# Patient Record
Sex: Female | Born: 1975 | Race: Black or African American | Hispanic: No | Marital: Married | Smoking: Never smoker
Health system: Southern US, Community
[De-identification: ages and names within clinical notes are randomized; demographics above are authoritative.]

## PROBLEM LIST (undated history)

## (undated) DIAGNOSIS — F329 Major depressive disorder, single episode, unspecified: Secondary | ICD-10-CM

## (undated) DIAGNOSIS — G43909 Migraine, unspecified, not intractable, without status migrainosus: Secondary | ICD-10-CM

## (undated) DIAGNOSIS — F32A Depression, unspecified: Secondary | ICD-10-CM

## (undated) DIAGNOSIS — I10 Essential (primary) hypertension: Secondary | ICD-10-CM

## (undated) DIAGNOSIS — F419 Anxiety disorder, unspecified: Secondary | ICD-10-CM

## (undated) DIAGNOSIS — D329 Benign neoplasm of meninges, unspecified: Secondary | ICD-10-CM

## (undated) HISTORY — DX: Depression, unspecified: F32.A

## (undated) HISTORY — DX: Essential (primary) hypertension: I10

## (undated) HISTORY — DX: Anxiety disorder, unspecified: F41.9

---
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
## 2000-02-10 ENCOUNTER — Other Ambulatory Visit: Admission: RE | Admit: 2000-02-10 | Discharge: 2000-02-10 | Payer: Self-pay | Admitting: Obstetrics & Gynecology

## 2000-06-22 ENCOUNTER — Inpatient Hospital Stay (HOSPITAL_COMMUNITY): Admission: AD | Admit: 2000-06-22 | Discharge: 2000-06-26 | Payer: Self-pay | Admitting: Obstetrics & Gynecology

## 2000-07-17 ENCOUNTER — Ambulatory Visit (HOSPITAL_COMMUNITY): Admission: RE | Admit: 2000-07-17 | Discharge: 2000-07-17 | Payer: Self-pay | Admitting: Obstetrics & Gynecology

## 2000-07-17 ENCOUNTER — Encounter: Payer: Self-pay | Admitting: Obstetrics & Gynecology

## 2000-07-23 ENCOUNTER — Encounter (INDEPENDENT_AMBULATORY_CARE_PROVIDER_SITE_OTHER): Payer: Self-pay | Admitting: Specialist

## 2000-07-23 ENCOUNTER — Inpatient Hospital Stay (HOSPITAL_COMMUNITY): Admission: AD | Admit: 2000-07-23 | Discharge: 2000-07-27 | Payer: Self-pay | Admitting: Obstetrics and Gynecology

## 2000-07-25 ENCOUNTER — Encounter: Payer: Self-pay | Admitting: *Deleted

## 2000-09-04 ENCOUNTER — Other Ambulatory Visit: Admission: RE | Admit: 2000-09-04 | Discharge: 2000-09-04 | Payer: Self-pay | Admitting: Obstetrics & Gynecology

## 2001-11-22 ENCOUNTER — Other Ambulatory Visit: Admission: RE | Admit: 2001-11-22 | Discharge: 2001-11-22 | Payer: Self-pay | Admitting: Obstetrics & Gynecology

## 2002-02-07 ENCOUNTER — Other Ambulatory Visit: Admission: RE | Admit: 2002-02-07 | Discharge: 2002-02-07 | Payer: Self-pay | Admitting: Obstetrics and Gynecology

## 2002-08-08 ENCOUNTER — Other Ambulatory Visit: Admission: RE | Admit: 2002-08-08 | Discharge: 2002-08-08 | Payer: Self-pay | Admitting: Obstetrics and Gynecology

## 2003-01-23 ENCOUNTER — Other Ambulatory Visit: Admission: RE | Admit: 2003-01-23 | Discharge: 2003-01-23 | Payer: Self-pay | Admitting: Obstetrics and Gynecology

## 2009-02-01 ENCOUNTER — Encounter: Admission: RE | Admit: 2009-02-01 | Discharge: 2009-02-21 | Payer: Self-pay | Admitting: Specialist

## 2009-10-15 ENCOUNTER — Ambulatory Visit (HOSPITAL_COMMUNITY): Admission: RE | Admit: 2009-10-15 | Discharge: 2009-10-15 | Payer: Self-pay | Admitting: Obstetrics & Gynecology

## 2010-01-16 ENCOUNTER — Encounter
Admission: RE | Admit: 2010-01-16 | Discharge: 2010-01-16 | Payer: Self-pay | Source: Home / Self Care | Attending: Obstetrics & Gynecology | Admitting: Obstetrics & Gynecology

## 2010-03-14 ENCOUNTER — Inpatient Hospital Stay (HOSPITAL_COMMUNITY)
Admission: AD | Admit: 2010-03-14 | Discharge: 2010-03-17 | Payer: Self-pay | Source: Home / Self Care | Attending: Obstetrics & Gynecology | Admitting: Obstetrics & Gynecology

## 2010-03-18 LAB — CBC
HCT: 33.7 % — ABNORMAL LOW (ref 36.0–46.0)
HCT: 28.9 % — ABNORMAL LOW (ref 36.0–46.0)
Hemoglobin: 10.8 g/dL — ABNORMAL LOW (ref 12.0–15.0)
Hemoglobin: 9.3 g/dL — ABNORMAL LOW (ref 12.0–15.0)
MCH: 24.4 pg — ABNORMAL LOW (ref 26.0–34.0)
MCH: 24.6 pg — ABNORMAL LOW (ref 26.0–34.0)
MCHC: 32 g/dL (ref 30.0–36.0)
MCHC: 32.2 g/dL (ref 30.0–36.0)
MCV: 76.2 fL — ABNORMAL LOW (ref 78.0–100.0)
MCV: 76.5 fL — ABNORMAL LOW (ref 78.0–100.0)
Platelets: 227 10*3/uL (ref 150–400)
Platelets: 220 10*3/uL (ref 150–400)
RBC: 4.42 MIL/uL (ref 3.87–5.11)
RBC: 3.78 MIL/uL — ABNORMAL LOW (ref 3.87–5.11)
RDW: 13.2 % (ref 11.5–15.5)
RDW: 13.1 % (ref 11.5–15.5)
WBC: 9.6 10*3/uL (ref 4.0–10.5)
WBC: 15.1 10*3/uL — ABNORMAL HIGH (ref 4.0–10.5)

## 2010-03-18 LAB — GLUCOSE, CAPILLARY: Glucose-Capillary: 88 mg/dL (ref 70–99)

## 2010-03-18 LAB — RPR: RPR Ser Ql: NONREACTIVE

## 2010-03-20 ENCOUNTER — Ambulatory Visit
Admission: RE | Admit: 2010-03-20 | Discharge: 2010-03-20 | Payer: Self-pay | Source: Home / Self Care | Admitting: Obstetrics & Gynecology

## 2010-03-22 ENCOUNTER — Ambulatory Visit
Admission: RE | Admit: 2010-03-22 | Discharge: 2010-03-22 | Payer: Self-pay | Source: Home / Self Care | Admitting: Obstetrics & Gynecology

## 2010-03-26 ENCOUNTER — Ambulatory Visit
Admission: RE | Admit: 2010-03-26 | Discharge: 2010-03-26 | Payer: Self-pay | Source: Home / Self Care | Attending: Obstetrics & Gynecology | Admitting: Obstetrics & Gynecology

## 2010-03-28 ENCOUNTER — Encounter (HOSPITAL_COMMUNITY)
Admission: RE | Admit: 2010-03-28 | Discharge: 2010-03-28 | Disposition: A | Discharge status: Home / Self Care | Payer: Self-pay | Source: Ambulatory Visit | Attending: Obstetrics & Gynecology | Admitting: Obstetrics & Gynecology

## 2010-03-28 ENCOUNTER — Encounter (HOSPITAL_COMMUNITY)
Admission: RE | Admit: 2010-03-28 | Discharge: 2010-03-28 | Disposition: A | Discharge status: Home / Self Care | Payer: Self-pay | Source: Ambulatory Visit

## 2010-03-28 DIAGNOSIS — O923 Agalactia: Secondary | ICD-10-CM | POA: Insufficient documentation

## 2010-04-01 ENCOUNTER — Encounter (HOSPITAL_COMMUNITY): Payer: Managed Care, Other (non HMO)

## 2010-04-08 ENCOUNTER — Ambulatory Visit (HOSPITAL_COMMUNITY)
Admission: RE | Admit: 2010-04-08 | Discharge: 2010-04-08 | Disposition: A | Discharge status: Home / Self Care | Payer: Self-pay | Source: Ambulatory Visit | Attending: Obstetrics & Gynecology | Admitting: Obstetrics & Gynecology

## 2010-04-08 DIAGNOSIS — O923 Agalactia: Secondary | ICD-10-CM | POA: Insufficient documentation

## 2010-04-28 ENCOUNTER — Encounter (HOSPITAL_COMMUNITY)
Admission: RE | Admit: 2010-04-28 | Discharge: 2010-04-28 | Disposition: A | Discharge status: Home / Self Care | Payer: Self-pay | Source: Ambulatory Visit | Attending: Obstetrics & Gynecology | Admitting: Obstetrics & Gynecology

## 2010-04-28 DIAGNOSIS — O923 Agalactia: Secondary | ICD-10-CM | POA: Insufficient documentation

## 2010-04-29 ENCOUNTER — Encounter (HOSPITAL_COMMUNITY)
Admission: RE | Admit: 2010-04-29 | Discharge: 2010-04-29 | Disposition: A | Discharge status: Home / Self Care | Payer: Self-pay | Source: Ambulatory Visit | Attending: Obstetrics & Gynecology | Admitting: Obstetrics & Gynecology

## 2010-06-29 ENCOUNTER — Encounter (HOSPITAL_COMMUNITY)
Admission: RE | Admit: 2010-06-29 | Discharge: 2010-06-29 | Disposition: A | Discharge status: Home / Self Care | Payer: Self-pay | Source: Ambulatory Visit | Attending: Obstetrics & Gynecology | Admitting: Obstetrics & Gynecology

## 2010-06-29 DIAGNOSIS — O923 Agalactia: Secondary | ICD-10-CM | POA: Insufficient documentation

## 2010-07-12 NOTE — H&P (Signed)
[facility]  Patient:    Teresa Liu, Teresa Liu                    MRN: [id number] Adm. Date:  [id number] Attending:  Maxie Better                         History and Physical  REASON FOR ADMISSION:         Intrauterine pregnancy at 33 weeks and 5 days. Spontaneous rupture of membranes.  HISTORY OF PRESENT ILLNESS:   This is a 35 year old single black female, gravida 1, para 0, abortus 0 with a due date of August 08, 2000 being admitted at 33 weeks and 5 days after experiencing spontaneous rupture of membranes on Jul 23, 2000 at 10:40 p.m. with abundant clear fluid.  She comes in denying any contractions or any pain, reporting good fetal activity and denying any bleeding.  Her group  B Strep was done in the office on Jul 23, 2000 and is still pending.  PRENATAL COURSE:              Blood type B positive.  Sickle cell trait negative. Thalassemia negative.  RPR nonreactive.  Rubella immune.  HBSAG negative.  HIV nonreactive.  Gonorrhea negative.  Chlamydia negative.  A 16-week AFP was within normal limits.  A 20-week ultrasound revealed anterior placenta.  Amniotic fluid within normal limits .  Cervical length at 3.4 cm. Anatomy survey was overall normal except for a cord insertion mass suggestive of omphalocele.  Due to that finding, the patient was sent to [facility] for a level 2 ultrasound and genetic consult.  Their ultrasound revealed size equal dates.  A small omphalocele defect was found and mild bilateral pyelectasis was also found.  Amniocentesis was performed without complication with a normal female karyotype.  A 24-week ultrasound resolved pyelectasis, average for gestational age at 52rd percentile, amniotic fluid index normal, omphalocele not growing, unchanged from previous exam.  A 28-week glucose tolerance test was within normal limits.  A 29-week ultrasound revealed polyhydramnios with an amniotic fluid index at 24.9, or 99th  percentile. 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  The baby was average for gestational age, 58th percentile.  At that visit, the patient was found to have preterm cervical change with a vaginal exam of 1+ cm, 40% effaced, vertex -2 to -1.  She was sent to maternity admission to receive betamethasone and was admitted with contractions, which were managed with Procardia.  At 30 weeks, ultrasound revealed an unchanged omphalocele, average for gestational age, persistent polyhydramnios.  At 32 weeks, unchanged omphalocele, unchanged polyhydramnios, biophysical profile 8 on 8, normal Dopplers.  On May 24, at 32 weeks and 5 days, an ultrasound done at [facility] revealed small omphalocele.  Containing liver was noted as well as a mild enlargement of the right lateral cerebral ventricle measuring 11 mm.  Biophysical profile was 8 on 8.  The cervix was shortened with a 2 cm end length.  Ultrasound on May 30 (yesterday) revealed an unchanged omphalocele, average for gestational age with a weight evaluated at 5 lb 3 oz, in the 51st percentile.  Amniotic fluid index was now normal at 15.5, 62nd percentile.  The ventricles were remeasured and were within normal limits.  ALLERGIES:                    No known drug allergies.  PAST MEDICAL HISTORY:         History of migraines.  FAMILY HISTORY:               Father with chronic hypertension.  SOCIAL HISTORY:               She is single.  Non smoker.  [employer] customer representative.  PHYSICAL EXAMINATION:  VITAL SIGNS:                  Normal with blood pressure 110/63, temperature 97.2, pulse 77.  HEENT:                        Negative.  LUNGS:                        Clear.  HEART:                        Normal.  ABDOMEN:                      Gravid, nontender.  Size equal to date.  VAGINAL:                      (Performed by admitting nurse)  The cervix was 2-3 cm, 50% effaced, vertex -2 with  abundant clear fluid, positive Nitrazine, positive fern.  Fetal heart rate baseline 130-145.  Good variability with accelerations.  Occasional mild variable deceleration.  EXTREMITIES:                  Negative.  ASSESSMENT:                   Intrauterine pregnancy at 33 weeks and 5 days with premature rupture of membranes, group B Strep pending, small omphalocele.  PLAN:                         The patient is admitted with continuous monitoring, expectant management, pending group B Strep.  Will start antibiotics in active labor or with signs of chorioamnionitis.  The pediatric surgeon will be informed of the patients presence in the hospital and soon to be delivery.  Management discussed with Dr. Seymour Bars, who agrees. DD:  07/24/00 TD:  07/24/00 Job: [id number]

## 2010-07-12 NOTE — Discharge Summary (Signed)
[facility]  Patient:    Teresa Liu, Teresa Liu                    MRN: [id number] Adm. Date:  [id number] Disc. Date: [id number] Attending:  Silverio Lay A                           Discharge Summary  ADMISSION DIAGNOSES: 1. A 29-week intrauterine pregnancy. 2. Preterm labor.  DISCHARGE DIAGNOSES: 1. A 29-week intrauterine pregnancy. 2. Preterm labor.  PROCEDURES: 1. Admission for intravenous tocolysis and administration of intramuscular    betamethasone. 2. Electronic fetal monitoring.  HISTORY OF PRESENT ILLNESS:  The patient is a 35 year old African-American female, gravida 1, para 0, who presented initially at the office at ______ OB/GYN, was seen by Dr. Seymour Bars.  At a routine cervical check, she was noted to be 1 cm dilated and 50% effaced.  The patient was not initially aware of cessation of contractions, but describe a feeling of tightness and balling up. She was evaluated at maternity admissions and was found to be contracting irregularly every 2-7 minutes and cervix was again noted to 1 cm dilated, approximately 50% effaced.  Given this, she was admitted for diagnosis of preterm labor.  PRENATAL CARE:  At ______ OB/GYN with Dr. Seymour Bars.  Was complicated by fetal omphalocele.  Amniocentesis revealed normal chromosome complement.  The patient was noted on the day of admission by ultrasound to have stable omphalocele, polyhydramnios, and appropriate growth for gestational age.  HOSPITAL COURSE:  The patient was admitted to the antepartum service and was started on intravenous magnesium sulfate.  In addition, she was given betamethasone to accelerate fetal lung maturity.  She was maintained on ampicillin while group B strep tests were pending.  With magnesium, the contractions steadily decreased and subsequently resolved. She was given approximately 48 hours of tocolysis, then magnesium was discontinued.  She was monitored on bedrest in the  hospital and subsequently developed recurrence of contractions.  These were treated with oral Procardia and subcutaneous terbutaline.  She did not demonstrate any further change.  The patient was ultimately discharged home on the fifth hospital day at 29-3/7 weeks.  DISCHARGE MEDICATIONS:  Procardia 10 mg p.o. q.6h.  DISCHARGE INSTRUCTIONS:  Standard preterm labor precautions were given.  In addition, the patient was instructed regarding bed rest and pelvic rest. Followup at ______ OB/GYN with Dr. Seymour Bars on May 6.  CONDITION AT DISCHARGE:  Stable and satisfactory.     DD:  07/09/00 TD:  07/09/00 Job: [id number]

## 2010-07-30 ENCOUNTER — Encounter (HOSPITAL_COMMUNITY)
Admission: RE | Admit: 2010-07-30 | Discharge: 2010-07-30 | Disposition: A | Discharge status: Home / Self Care | Payer: 59 | Source: Ambulatory Visit | Attending: Obstetrics & Gynecology | Admitting: Obstetrics & Gynecology

## 2010-07-30 DIAGNOSIS — O923 Agalactia: Secondary | ICD-10-CM | POA: Insufficient documentation

## 2010-08-23 ENCOUNTER — Inpatient Hospital Stay (HOSPITAL_COMMUNITY)
Admission: EM | Admit: 2010-08-23 | Discharge: 2010-08-29 | DRG: 027 | Disposition: A | Discharge status: Home / Self Care | Payer: 59 | Attending: Neurological Surgery | Admitting: Neurological Surgery

## 2010-08-23 DIAGNOSIS — G43909 Migraine, unspecified, not intractable, without status migrainosus: Secondary | ICD-10-CM | POA: Diagnosis present

## 2010-08-23 DIAGNOSIS — D320 Benign neoplasm of cerebral meninges: Principal | ICD-10-CM | POA: Diagnosis present

## 2010-08-23 DIAGNOSIS — M129 Arthropathy, unspecified: Secondary | ICD-10-CM | POA: Diagnosis present

## 2010-08-23 LAB — URINALYSIS, ROUTINE W REFLEX MICROSCOPIC
Bilirubin Urine: NEGATIVE
Glucose, UA: NEGATIVE mg/dL
Hgb urine dipstick: NEGATIVE
Ketones, ur: NEGATIVE mg/dL
Nitrite: NEGATIVE
Protein, ur: NEGATIVE mg/dL
Specific Gravity, Urine: 1.021 (ref 1.005–1.030)
Urobilinogen, UA: 0.2 mg/dL (ref 0.0–1.0)
pH: 6.5 (ref 5.0–8.0)

## 2010-08-23 LAB — URINE MICROSCOPIC-ADD ON

## 2010-08-23 LAB — POCT PREGNANCY, URINE: Preg Test, Ur: NEGATIVE

## 2010-08-24 ENCOUNTER — Emergency Department (HOSPITAL_COMMUNITY): Payer: 59

## 2010-08-24 ENCOUNTER — Inpatient Hospital Stay (HOSPITAL_COMMUNITY): Payer: 59

## 2010-08-24 LAB — COMPREHENSIVE METABOLIC PANEL
ALT: 16 U/L (ref 0–35)
AST: 22 U/L (ref 0–37)
Albumin: 3 g/dL — ABNORMAL LOW (ref 3.5–5.2)
Alkaline Phosphatase: 99 U/L (ref 39–117)
BUN: 10 mg/dL (ref 6–23)
CO2: 25 mEq/L (ref 19–32)
Calcium: 9.1 mg/dL (ref 8.4–10.5)
Chloride: 104 mEq/L (ref 96–112)
Creatinine, Ser: 0.62 mg/dL (ref 0.50–1.10)
GFR calc Af Amer: >60 mL/min (ref >60)
GFR calc non Af Amer: >60 mL/min (ref >60)
Glucose, Bld: 106 mg/dL — ABNORMAL HIGH (ref 70–99)
Potassium: 4.3 mEq/L (ref 3.5–5.1)
Sodium: 139 mEq/L (ref 135–145)
Total Bilirubin: 0.1 mg/dL — ABNORMAL LOW (ref 0.3–1.2)
Total Protein: 7 g/dL (ref 6.0–8.3)

## 2010-08-24 LAB — POCT I-STAT, CHEM 8
BUN: 9 mg/dL (ref 6–23)
Calcium, Ion: 1.16 mmol/L (ref 1.12–1.32)
Chloride: 107 mEq/L (ref 96–112)
Creatinine, Ser: 0.7 mg/dL (ref 0.50–1.10)
Glucose, Bld: 109 mg/dL — ABNORMAL HIGH (ref 70–99)
HCT: 39 % (ref 36.0–46.0)
Hemoglobin: 13.3 g/dL (ref 12.0–15.0)
Potassium: 4 mEq/L (ref 3.5–5.1)
Sodium: 142 mEq/L (ref 135–145)
TCO2: 26 mmol/L (ref 0–100)

## 2010-08-24 LAB — CBC
HCT: 38.1 % (ref 36.0–46.0)
Hemoglobin: 12.2 g/dL (ref 12.0–15.0)
MCH: 25.6 pg — ABNORMAL LOW (ref 26.0–34.0)
MCHC: 32 g/dL (ref 30.0–36.0)
MCV: 80 fL (ref 78.0–100.0)
Platelets: 335 10*3/uL (ref 150–400)
RBC: 4.76 MIL/uL (ref 3.87–5.11)
RDW: 12.3 % (ref 11.5–15.5)
WBC: 12 10*3/uL — ABNORMAL HIGH (ref 4.0–10.5)

## 2010-08-24 LAB — CK TOTAL AND CKMB (NOT AT ARMC)
CK, MB: 1.4 ng/mL (ref 0.3–4.0)
Relative Index: 0.9 (ref 0.0–2.5)
Total CK: 154 U/L (ref 7–177)

## 2010-08-24 LAB — MRSA PCR SCREENING: MRSA by PCR: NEGATIVE

## 2010-08-24 LAB — TROPONIN I: Troponin I: <0.3 ng/mL (ref <0.30)

## 2010-08-24 MED ORDER — GADOBENATE DIMEGLUMINE 529 MG/ML IV SOLN
15.0000 mL | Freq: Once | INTRAVENOUS | Status: AC | PRN
  Administered 2010-08-24: 15 mL via INTRAVENOUS

## 2010-08-25 LAB — PROTIME-INR
INR: 1.04 (ref 0.00–1.49)
Prothrombin Time: 13.8 seconds (ref 11.6–15.2)

## 2010-08-25 LAB — APTT: aPTT: 27 seconds (ref 24–37)

## 2010-08-26 ENCOUNTER — Other Ambulatory Visit: Payer: Self-pay | Admitting: Neurological Surgery

## 2010-08-26 HISTORY — PX: CRANIOTOMY FOR TUMOR: SUR345

## 2010-08-26 LAB — CBC
HCT: 37.6 % (ref 36.0–46.0)
Hemoglobin: 12.4 g/dL (ref 12.0–15.0)
MCH: 25.8 pg — ABNORMAL LOW (ref 26.0–34.0)
MCHC: 33 g/dL (ref 30.0–36.0)
MCV: 78.2 fL (ref 78.0–100.0)
Platelets: 379 10*3/uL (ref 150–400)
RBC: 4.81 MIL/uL (ref 3.87–5.11)
RDW: 12.3 % (ref 11.5–15.5)
WBC: 23.4 10*3/uL — ABNORMAL HIGH (ref 4.0–10.5)

## 2010-08-26 LAB — BASIC METABOLIC PANEL
BUN: 17 mg/dL (ref 6–23)
CO2: 26 mEq/L (ref 19–32)
Calcium: 9.1 mg/dL (ref 8.4–10.5)
Chloride: 105 mEq/L (ref 96–112)
Creatinine, Ser: 0.66 mg/dL (ref 0.50–1.10)
GFR calc Af Amer: >60 mL/min (ref >60)
GFR calc non Af Amer: >60 mL/min (ref >60)
Glucose, Bld: 203 mg/dL — ABNORMAL HIGH (ref 70–99)
Potassium: 4.3 mEq/L (ref 3.5–5.1)
Sodium: 140 mEq/L (ref 135–145)

## 2010-08-26 LAB — ABO/RH: ABO/RH(D): B POS

## 2010-08-28 ENCOUNTER — Inpatient Hospital Stay (HOSPITAL_COMMUNITY): Payer: 59

## 2010-08-29 LAB — CROSSMATCH
ABO/RH(D): B POS
Antibody Screen: NEGATIVE
Unit division: 0
Unit division: 0

## 2010-08-29 NOTE — Op Note (Signed)
NAMERICHELLE, GLICK NO.:  [id number]  MEDICAL RECORD NO.:  [id number]  LOCATION:  3112                         FACILITY:  [facility]  PHYSICIAN:  Stefani Dama, M.D.  DATE OF BIRTH:  1975-08-09  DATE OF PROCEDURE:  08/26/2010 DATE OF DISCHARGE:                              OPERATIVE REPORT   PREOPERATIVE DIAGNOSIS:  Right frontal brain tumor.  POSTOPERATIVE DIAGNOSIS:  Right frontal brain tumor.  OPERATION:  Right frontal craniotomy, gross total resection of brain tumor with operative microscope and ultrasonic aspirator.  SURGEON:  Stefani Dama, MD  FIRST ASSISTANT:  Coletta Memos, MD  ANESTHESIA:  General endotracheal.  INDICATIONS:  Teresa Liu is a 35 year old black female who has had significant history of headaches over the past number of years seems to have been worse over the past few months.  She delivered a baby about 6 months ago and stopped nursing because of the severity and continuity of the headaches.  An MRI scan and CT scan was performed on Friday when she presented to the emergency room after a syncopal episode and was noted that she had a large right frontal tumor.  PROCEDURE:  The patient was brought to the operating room supine on the stretcher.  After smooth induction of general endotracheal anesthesia, her head was placed in the three-point headrest, turned slightly to the left side and the right frontal region was shaved, prepped with alcohol and DuraPrep, and draped in a sterile fashion.  A curvilinear incision for a standard right frontal craniotomy was performed and the dissection was carried down through the galea.  The scalp was then reflected forward and hemostasis from the galea was achieved with a series of Raney clips.  Temporalis fascia was incised on the posterior aspect near the incision and the sites for bur holes in the frontal keyhole region and in the posterior portion near the incision were chosen.  It was chosen  to perform an osteoplastic craniotomy flap to maintain the integrity and the blood supply to the bone.  Through the initial bur holes which bled rather profusely, we were then able to pass a craniotome to make the frontal portion of the craniotomy opening.  Then, the Gigli saw was passed through the 2 posterior bur holes from the keyhole posteriorly and the Gigli saw was used to cut the bone and raised an osteoplastic right frontal bone flap.  This bone flap was then cleaned and wrapped in a saline-soaked gauze and placed under traction with traction stitches.  The dura was noted be moderately tense. Because the tumor was noted to be in a frontal region, palpation in the frontal region yielded a clear border around the tumor.  Then, an elliptical dural opening was created based on the frontal lobe and the hardened area where the tumor was clearly present.  The border between the brain and the tumor was readily identifiable.  The dural flap was then secured inferiorly under some traction sutures and I proceeded with Dr. Franky Macho then to start a resection of the tumor dissecting first the peel border where it was attached to the normal brain and working into the tumor.  The tumor itself was rather fibrous.  We  used the CUSA ultrasonic aspirator for portions of the resection.  However, because of either the nature of the fibrous tumor or the poor quality of the CUSA machine, we were not able to resect significant quantities using CUSA, however, down near the posterior aspect of the tumor where it was attached to the pedicle, we were able to use the CUSA with some success. We then resected and shelled the central portion of the tumor to allow for dissection of the tumor around the posterior aspect.  The tumor itself was rather hemorrhagic and bled profusely.  By dissecting along the floor of the frontal fossa, we were able to secure its vascular attachment which appeared to be from a number of  peel and durally based vessels.  Once this was completed, the tumor blanched significantly.  We were then able to disarticulate the tumor from the inferior most attachments and then carefully dissected from the peel border of the tumor with the frontal lobe.  The bulk of the tumor was then removed in an en bloc piece.  Further dissection yielded attachments to the dura. The dura was cauterized where it was clearly invaded with the tumor. The bone was also explored and was noted to be slightly eroded. However, once the dura was peeled and the bone was debrided with high- speed bur, we felt that all the tumor was indeed removed.  Careful inspection yielded no other elements of the tumor itself and there was noted to be no invasion directly into the frontal sinus.  With this, we completed the resection, obtained hemostasis from the peel margins, removed a series of cottonoids.  We did explore the surgical bed in particularly the frontal pole under the operating microscope which allowed greater visualization to do it and a complete dissection and debridement of the bone from any of remaining meningioma.  Once this was completed, the microscope was removed from the field.  The dura was closed with interrupted 4-0 Nurolon sutures, tack-up sutures were placed around the perimetry, and the osteoplastic flap was then replaced and secured with small titanium plates.  The galea was then closed with 2-0 Vicryl in interrupted fashion and surgical staples were used in the scalp and 4-0 nylon sutures were used on either end where the incision came out from beyond the hairline.  A dry sterile dressing was applied with Telfa and OpSite.  A wrap was applied to the patient's head.  She tolerated the procedure well.  Blood loss was estimated 350 mL.  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     Stefani Dama, M.D.     Teresa Liu  D:  08/26/2010  T:  08/27/2010  Job:  [id number]  Electronically Signed  by Barnett Abu M.D. on 08/29/2010 05:24:57 PM

## 2010-08-29 NOTE — Consult Note (Signed)
  NAMEASHELYNN, MARKS NO.:  [id number]  MEDICAL RECORD NO.:  [id number]  LOCATION:  3112                         FACILITY:  [facility]  PHYSICIAN:  Stefani Dama, M.D.  DATE OF BIRTH:  February 07, 1976  DATE OF CONSULTATION:  08/24/2010 DATE OF DISCHARGE:                                CONSULTATION   REQUESTOR:  Dione Booze, MD  REASON FOR REQUEST:  Large right frontal brain tumor.  HISTORY OF PRESENT ILLNESS:  Teresa Liu is a 35 year old right- handed black female who has a longstanding history of some migraines. Today, she had some modest migraine symptoms, felt generally weakened and then had a syncopal episode at her house.  She was brought to [facility] Emergency Department and a CT scan ultimately determined that there was a large right frontal mass with significant right-to-left shift and significant right minimal edema.  I was called in consultation at the current time.  Past medical history reveals that the patient's general health has been good.  She is married.  She has a 33 year old and a 13-month-old child at home.  She has no known drug allergies.  She has been using NuvaRing and diclofenac as her only medications. Diclofenac was for some arthritic process in the hand.  Social history reveals that she is married.  She had been working for [employer].  There is a history of migraine headaches, otherwise the patient has been healthy.  At the current time, CT scan demonstrates presence of a large right frontal mass.  Plan is to admit the patient and then have her undergo an MRI scan and plan for surgical intervention.  I indicated to the patient that the surgery should be urgent even though the problem may not be emergent.  We will plan and discussion after the MRI has been completed. For other details, please see the history and physical.     Stefani Dama, M.D.     Teresa Liu  D:  08/24/2010  T:  08/24/2010  Job:   [id number]  Electronically Signed by Barnett Abu M.D. on 08/29/2010 05:24:39 PM

## 2010-08-29 NOTE — H&P (Signed)
  Teresa, Liu NO.:  [id number]  MEDICAL RECORD NO.:  [id number]  LOCATION:                                 FACILITY:  PHYSICIAN:  Stefani Dama, M.D.  DATE OF BIRTH:  1975/09/04  DATE OF ADMISSION:  08/24/2010 DATE OF DISCHARGE:                             HISTORY & PHYSICAL   ADMISSION DIAGNOSIS:  Right frontal brain tumor.  HISTORY OF PRESENT ILLNESS:  Teresa Liu is a 35 year old right- handed black female who had onset of syncopal episode after she felt dizzy and queasy all day with some low-grade headache.  The patient was found on the CT scan to have a large right frontal mass measuring at least 4 cm in diameter, does appear to be based on the skull above the orbit.  Past medical history reveals that her general health has been good.  She denies any significant medical problems.  She notes intolerance of most nonsteroidal anti-inflammatories, though she has been on diclofenacrecently for some leg pain in addition to having used nonsteroidal anti- inflammatory for a prolonged period of time.  Past medical history reveals that the patient's general health has been normal.  She has 2 living children, ages 30 and 5 months.  She has no known allergies.  Medications include NuvaRing and diclofenac.  Social history reveals that she is married.  Mother recently had died from pancreatic cancer.  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  Systems review is notable for the migraine headaches.  PHYSICAL EXAMINATION:  GENERAL:  She is alert, oriented, and cooperative individual.  No overt distress. VITAL SIGNS:  Blood pressure is 117/28, heart rate 72 and regular, respirations are 16 and unlabored. NEUROLOGIC:  Her cranial nerve examination reveals her pupils are 4 mm, briskly reactive to light and accommodation.  Extraocular movements are full.  Face is symmetric to grimace.  Tongue and uvula in the midline. Sclerae and  conjunctivae are clear. EXTREMITIES:  In the upper extremities, there is no evidence of a drift and motor strength appears symmetric.  In the lower extremities, there is evidence of some proximal muscle drift.  The patient's station and gait are within limits of normal.  IMPRESSION:  The patient has evidence of a large right frontal mass.  PLAN:  Work this up with an MRI.  She will need to undergo this to determine better what type of tumor this is.  Surgical resection is appropriate.  In the meantime, we will keep the patient comfortable with some diversionary tactics.  We will plan the surgery for her plans as scheduled.     Stefani Dama, M.D.     Teresa Liu  D:  08/24/2010  T:  08/24/2010  Job:  [id number]  Electronically Signed by Teresa Liu M.D. on 08/29/2010 05:24:45 PM

## 2010-08-30 ENCOUNTER — Encounter (HOSPITAL_COMMUNITY)
Admission: RE | Admit: 2010-08-30 | Discharge: 2010-08-30 | Disposition: A | Discharge status: Home / Self Care | Payer: 59 | Source: Ambulatory Visit | Attending: Obstetrics & Gynecology | Admitting: Obstetrics & Gynecology

## 2010-08-30 DIAGNOSIS — O923 Agalactia: Secondary | ICD-10-CM | POA: Insufficient documentation

## 2010-09-11 ENCOUNTER — Other Ambulatory Visit (HOSPITAL_COMMUNITY): Payer: Self-pay | Admitting: Neurological Surgery

## 2010-09-11 DIAGNOSIS — D496 Neoplasm of unspecified behavior of brain: Secondary | ICD-10-CM

## 2010-09-30 NOTE — Discharge Summary (Signed)
[name], [name] NO.:  [id number]  MEDICAL RECORD NO.:  [id number]  LOCATION:  3035                         FACILITY:  [facility]  PHYSICIAN:  Stefani Dama, M.D.  DATE OF BIRTH:  Apr 24, 1975  DATE OF ADMISSION:  08/23/2010 DATE OF DISCHARGE:  08/29/2010                              DISCHARGE SUMMARY   ADMITTING DIAGNOSIS:  Large right frontal brain mass.  DISCHARGE DIAGNOSIS:  Meningioma, grade 1.  OPERATIONS AND PROCEDURES:  Right frontal craniotomy for tumor resection.  BRIEF HISTORY AND HOSPITAL COURSE:  Teresa Liu is a 35 year old right hand black female who has a longstanding history of some migraine headaches.  She had a headache on June 29, generally felt weak, and then had a syncopal episode at her house, came to emergency room at [facility].  CT scan showed there was  a large frontal mass with right-to-left shift and edema.  Dr. Barnett Abu was consulted for treatment, recommendations, and followup.  She was admitted to the hospital to the neurosurgical ICU.  An MRI scan was obtained and plans were made for surgical resection.  The imaging studies CT scan and MRI were consistent with probable meningioma.  She remained neurologically intact during her hospital stay and plans were made for right frontal craniotomy for tumor resection on August 26, 2010, tolerated this procedure well, stabilized in the recovery room, and transferred to the neurosurgical ICU.  First day postoperatively, the patient was doing well, transferred to the floor.  Her IV was hep locked.  Her Decadron was discontinued and we got a repeat CT scan on August 28, 2010.  This still showed some significant edema and it was felt that we would keep her on Decadron and slowly taper her.  She was a little bit unsteady on her feet with therapy and we kept her one more day for safe mobilization and to be sure she was ready for discharge home.  On August 29, 2010, the patient  was eating well, voiding well, ambulating safely, and ready for discharge home, minimal headache and very pleased with overall progress.  DISCHARGE CONDITION:  Stable, improved.  DISCHARGE INSTRUCTIONS:  Discharge home.  Follow up in 7-10 days for staple removal.  Given prescription for Decadron taper which will be slowly taper over the next few weeks, Vicodin for pain, and Keppra for seizure prevention.  Decadron taper 2 mg tablets 2 twice a day for 3 days, then 2 in the morning and 1 at night for 3 days, then 1 in the morning and 1 at night for 3 days, and 1 in the morning of 3 days, and then stop.  Vicodin 5/325 one p.o. q.4-6 h p.r.n. pain and Keppra 500 mg b.i.d.  Continue on her home birth control medicine.  Follow up sooner if she has any questions concerns.  Mobilize about her house.  No driving.  Contact our office prior to follow up if she has any change in her neurological status. All questions encouraged and addressed.     Teresa Liu.   ______________________________ Stefani Dama, M.D.  SCI/MEDQ  D:  08/29/2010  T:  08/30/2010  Job:  [id number]  Electronically Signed by Fannie Knee  Bobbe Liu. on 08/30/2010 04:39:13 PM Electronically Signed by Barnett Abu M.D. on 09/30/2010 07:28:17 AM

## 2010-10-02 ENCOUNTER — Encounter (HOSPITAL_COMMUNITY): Payer: Self-pay

## 2010-10-02 ENCOUNTER — Ambulatory Visit (HOSPITAL_COMMUNITY)
Admission: RE | Admit: 2010-10-02 | Discharge: 2010-10-02 | Disposition: A | Discharge status: Home / Self Care | Payer: 59 | Source: Ambulatory Visit | Attending: Neurological Surgery | Admitting: Neurological Surgery

## 2010-10-02 DIAGNOSIS — R51 Headache: Secondary | ICD-10-CM | POA: Insufficient documentation

## 2010-10-02 DIAGNOSIS — D496 Neoplasm of unspecified behavior of brain: Secondary | ICD-10-CM

## 2010-10-02 HISTORY — DX: Migraine, unspecified, not intractable, without status migrainosus: G43.909

## 2010-10-02 HISTORY — DX: Benign neoplasm of meninges, unspecified: D32.9

## 2010-10-02 MED ORDER — IOHEXOL 300 MG/ML  SOLN
80.0000 mL | Freq: Once | INTRAMUSCULAR | Status: AC | PRN
  Administered 2010-10-02: 80 mL via INTRAVENOUS

## 2013-09-27 ENCOUNTER — Emergency Department (INDEPENDENT_AMBULATORY_CARE_PROVIDER_SITE_OTHER)
Admission: EM | Admit: 2013-09-27 | Discharge: 2013-09-27 | Disposition: A | Discharge status: Home / Self Care | Payer: 59 | Source: Home / Self Care | Attending: Family Medicine | Admitting: Family Medicine

## 2013-09-27 ENCOUNTER — Encounter (HOSPITAL_COMMUNITY): Payer: Self-pay | Admitting: Emergency Medicine

## 2013-09-27 DIAGNOSIS — F411 Generalized anxiety disorder: Secondary | ICD-10-CM

## 2013-09-27 DIAGNOSIS — F419 Anxiety disorder, unspecified: Secondary | ICD-10-CM

## 2013-09-27 NOTE — Discharge Instructions (Signed)
[place] [facility].  Address: [address] [place], [place] [address] Phone: [phone] 24-Hour HELPLINE: [phone] or [phone]  http://www.helpguide.org/articles/anxiety/how-to-stop-worrying.htm -->shows different self help techniques.   Generalized Anxiety Disorder Generalized anxiety disorder (GAD) is a mental disorder. It interferes with life functions, including relationships, work, and school. GAD is different from normal anxiety, which everyone experiences at some point in their lives in response to specific life events and activities. Normal anxiety actually helps [place] prepare for and get through these life events and activities. Normal anxiety goes away after the event or activity is over.  GAD causes anxiety that is not necessarily related to specific events or activities. It also causes excess anxiety in proportion to specific events or activities. The anxiety associated with GAD is also difficult to control. GAD can vary from mild to severe. People with severe GAD can have intense waves of anxiety with physical symptoms (panic attacks).  SYMPTOMS The anxiety and worry associated with GAD are difficult to control. This anxiety and worry are related to many life events and activities and also occur more days than not for 6 months or longer. People with GAD also have three or more of the following symptoms (one or more in children):  Restlessness.   Fatigue.  Difficulty concentrating.   Irritability.  Muscle tension.  Difficulty sleeping or unsatisfying sleep. DIAGNOSIS GAD is diagnosed through an assessment by your health care provider. Your health care provider will ask you questions aboutyour mood,physical symptoms, and events in your life. Your health care provider may ask you about your medical history and use of alcohol or drugs, including prescription medicines. Your health care provider may also do a physical exam and blood tests. Certain medical  conditions and the use of certain substances can cause symptoms similar to those associated with GAD. Your health care provider may refer you to a mental health specialist for further evaluation. TREATMENT The following therapies are usually used to treat GAD:   Medication. Antidepressant medication usually is prescribed for long-term daily control. Antianxiety medicines may be added in severe cases, especially when panic attacks occur.   Talk therapy (psychotherapy). Certain types of talk therapy can be helpful in treating GAD by providing support, education, and guidance. A form of talk therapy called cognitive behavioral therapy can teach you healthy ways to think about and react to daily life events and activities.  Stress managementtechniques. These include yoga, meditation, and exercise and can be very helpful when they are practiced regularly. A mental health specialist can help determine which treatment is best for you. Some people see improvement with one therapy. However, other people require a combination of therapies. Document Released: 06/07/2012 Document Revised: 06/27/2013 Document Reviewed: 06/07/2012 [facility] Patient Information 2015 [place], [place]. This information is not intended to replace advice given to you by your health care provider. Make sure you discuss any questions you have with your health care provider.

## 2013-09-27 NOTE — ED Provider Notes (Signed)
CSN: [id number]     Arrival date & time 09/27/13  1545 History   First MD Initiated Contact with Patient 09/27/13 1607     Chief Complaint  Patient presents with  . Shortness of Breath   (Consider location/radiation/quality/duration/timing/severity/associated sxs/prior Treatment) Patient is a 38 y.o. female presenting with shortness of breath. The history is provided by the patient.  Shortness of Breath Severity:  Moderate Onset quality:  Gradual Duration:  2 weeks Timing:  Intermittent Progression:  Worsening Chronicity:  New Context: emotional upset   Relieved by:  None tried Worsened by:  Emotional stress Ineffective treatments:  None tried Associated symptoms: no chest pain, no cough, no fever, no neck pain, no rash, no sore throat, no vomiting and no wheezing   Risk factors: no hx of PE/DVT and no prolonged immobilization     Past Medical History  Diagnosis Date  . Migraines   . Meningioma    History reviewed. No pertinent past surgical history. History reviewed. No pertinent family history. History  Substance Use Topics  . Smoking status: Not on file  . Smokeless tobacco: Not on file  . Alcohol Use: Not on file   OB History   Grav Para Term Preterm Abortions TAB SAB Ect Mult Living                 Review of Systems  Constitutional: Negative for fever and chills.  HENT: Negative for sore throat.   Respiratory: Positive for chest tightness and shortness of breath. Negative for cough and wheezing.   Cardiovascular: Negative for chest pain.  Gastrointestinal: Negative for nausea, vomiting, diarrhea and constipation.  Musculoskeletal: Negative for neck pain.  Skin: Negative for rash.    Allergies  Review of patient's allergies indicates no known allergies.  Home Medications   Prior to Admission medications   Not on File   BP 125/90  Pulse 99  Temp(Src) 99 F (37.2 C) (Oral)  Resp 16  SpO2 98%  LMP 09/04/2013 Physical Exam  Constitutional: She is  oriented to person, place, and time. She appears well-developed and well-nourished. No distress.  HENT:  Head: Normocephalic and atraumatic.  Eyes: Conjunctivae are normal.  Neck: Normal range of motion.  Cardiovascular: Normal rate, regular rhythm, normal heart sounds and intact distal pulses.  Exam reveals no gallop and no friction rub.   No murmur heard. Pulmonary/Chest: Effort normal and breath sounds normal. No respiratory distress. She has no wheezes. She exhibits no tenderness.  Abdominal: Soft.  Musculoskeletal: Normal range of motion.  Neurological: She is alert and oriented to person, place, and time.  Skin: Skin is warm and dry. No rash noted. She is not diaphoretic.  Psychiatric: Her speech is normal and behavior is normal.    ED Course  Procedures (including critical care time) Labs Review Labs Reviewed - No data to display  Imaging Review No results found.   MDM   1. Anxiety    Care plan discussed with Dr. Juventino Slovak  Shortness of breath most likely related to some underlying anxiety. She reports fixating on certain thoughts and having trouble sleeping. She has excessive worry. She denies any travel or history of clots. Wells score 0. No unilateral leg swelling or pain. No history of asthma and saturating 98% on room air and not tachypneic. No signs of infection of PNA with patient afebrile and no cough. Physical exam is clear and not extra effort of breathing.  Recommend to follow up with behavioral health or with her PCP.  Rosemarie Ax, MD 09/27/13 1642  Rosemarie Ax, MD 09/27/13 [phone]

## 2013-09-27 NOTE — ED Notes (Signed)
C/o sob which has been going on for two weeks States she does have fast heart beat

## 2013-09-28 NOTE — ED Provider Notes (Signed)
Medical screening examination/treatment/procedure(s) were performed by resident physician or non-physician practitioner and as supervising physician I was immediately available for consultation/collaboration.   Pauline Good MD.   Billy Fischer, MD 09/28/13 [phone]

## 2014-05-12 ENCOUNTER — Other Ambulatory Visit: Payer: Self-pay | Admitting: Radiation Therapy

## 2014-05-12 DIAGNOSIS — D329 Benign neoplasm of meninges, unspecified: Secondary | ICD-10-CM

## 2014-05-25 ENCOUNTER — Encounter: Payer: Self-pay | Admitting: Radiation Oncology

## 2014-05-25 ENCOUNTER — Other Ambulatory Visit: Payer: 59

## 2014-05-25 ENCOUNTER — Ambulatory Visit
Admission: RE | Admit: 2014-05-25 | Discharge: 2014-05-25 | Disposition: A | Discharge status: Home / Self Care | Payer: 59 | Source: Ambulatory Visit | Attending: Radiation Oncology | Admitting: Radiation Oncology

## 2014-05-25 ENCOUNTER — Ambulatory Visit: Payer: 59

## 2014-05-25 ENCOUNTER — Ambulatory Visit: Payer: Self-pay

## 2014-05-25 DIAGNOSIS — D329 Benign neoplasm of meninges, unspecified: Secondary | ICD-10-CM

## 2014-05-25 DIAGNOSIS — D320 Benign neoplasm of cerebral meninges: Secondary | ICD-10-CM | POA: Insufficient documentation

## 2014-05-25 LAB — BUN AND CREATININE (CC13)
BUN: 9.5 mg/dL (ref 7.0–26.0)
Creatinine: 0.7 mg/dL (ref 0.6–1.1)
EGFR: >90 mL/min/{1.73_m2} (ref >90)

## 2014-05-25 MED ORDER — GADOBENATE DIMEGLUMINE 529 MG/ML IV SOLN: 17.0000 mL | Freq: Once | INTRAVENOUS | Status: AC | PRN

## 2014-05-25 NOTE — Progress Notes (Signed)
Patient for IV start and labs only today. Patient discharged to radiology.

## 2014-05-25 NOTE — Progress Notes (Signed)
Location/Histology of Brain Tumor: recurrent right frontal lobe meningioma  Patient presented with symptoms of:    Past or anticipated interventions, if any, per neurosurgery: "not keen on further surgical intervention"  Past or anticipated interventions, if any, per medical oncology: no  Dose of Decadron, if applicable: no  Recent neurologic symptoms, if any:   Seizures: no  Headaches:   Nausea: no  Dizziness/ataxia: no  Difficulty with hand coordination: no  Focal numbness/weakness: no  Visual deficits/changes: no  Confusion/Memory deficits: no  Painful bone metastases at present, if any:  SAFETY ISSUES:  Prior radiation? no  Pacemaker/ICD? no  Possible current pregnancy? no  Is the patient on methotrexate? no  Additional Complaints / other details: 39 year old right handed African American female. Craniotomy from tumor resection done 08/26/2010. Original tumor size 4 cm. Mother passed from pancreatic cancer. Married with two children. NKDA.

## 2014-05-28 NOTE — Progress Notes (Signed)
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    ICD-9-CM ICD-10-CM   1. 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 female who initially presented to the ED on 08/13/10 with dizziness and frontal headache.  Head CT o 08/23/13 showed a 4.4 cm right frontal extraaxial hyperdense mass.    Subsequent MRI confirmed a 4.7 cm enhancing mass with vasogenic edema and midline shift of 14 mm.    She underwent right frontal craniotomy with tumor resection by Dr. Ellene Route on 08/27/10 and pathology showed grade I meningioma.  Post-op CTs and subsequent MRI at one year showed no residual.  MRI on 03/30/14 showed a well defined 30 mm recurrent lesion within the right frontal bone centered in the right frontal sinus.  Higher resolution MRI confirmed this tumor size and location.    After discussing options with Dr. Ellene Route including surgical salvage, the patient has been referred for consideration of possible radiotherapy.  PREVIOUS RADIATION THERAPY: No  PAST MEDICAL HISTORY:  has a past medical history of Migraines and Meningioma.    PAST SURGICAL HISTORY: Past Surgical History  Procedure Laterality Date  . Craniotomy for tumor  08/26/2010    right frontal craniotomy, gross total resection of brain tumor    FAMILY HISTORY: family history includes Cancer in her mother.  SOCIAL HISTORY:  reports that she has never smoked. She has never used smokeless tobacco. She reports that she does not drink alcohol or use illicit drugs.  ALLERGIES: Review of patient's allergies  indicates no known allergies.  MEDICATIONS:  No current outpatient prescriptions on file.   No current facility-administered medications for this encounter.    REVIEW OF SYSTEMS:  A 15 point review of systems is documented in the electronic medical record. This was obtained by the nursing staff. However, I reviewed this with the patient to discuss relevant findings and make appropriate changes.  Pertinent items are noted in HPI.   PHYSICAL EXAM:  height is 5\' 4"  (1.626 m) and weight is 186 lb (84.369 kg). Her oral temperature is 98 F (36.7 C). Her blood pressure is 138/86 and her pulse is 103. Her respiration is 16 and oxygen saturation is 100%.   The patient was in no acute distress today. She is alert and oriented. Head is normocephalic and atraumatic. Extraocular muscles are intact. Cranial nerves are grossly intact. Speech and gait were intact. Motor strength is 5/5 throughout.  KPS = 100  100 - Normal; no complaints; no evidence of disease. 90   - Able to carry on normal activity; minor signs or symptoms of disease. 80   - Normal activity with effort; some signs or symptoms of disease. 22   - Cares for self; unable to carry on normal activity or to do active work. 60   - Requires occasional assistance, but is able to care for most of his personal needs. 50   - Requires considerable assistance and frequent medical care. 15   - Disabled; requires special care and assistance. 19   - Severely disabled; hospital admission is indicated although death not imminent. 20   -  Very sick; hospital admission necessary; active supportive treatment necessary. 10   - Moribund; fatal processes progressing rapidly. 0     - Dead  Karnofsky DA, [place], [place] LS and [place] J[facility] [phone]) The use of the nitrogen mustards in the palliative treatment of carcinoma: with particular reference to bronchogenic carcinoma Cancer 1 [phone]  LABORATORY DATA:  Lab Results  Component Value Date   WBC 23.4*  08/26/2010   HGB 12.4 08/26/2010   HCT 37.6 08/26/2010   MCV 78.2 08/26/2010   PLT 379 08/26/2010   Lab Results  Component Value Date   NA 140 08/26/2010   K 4.3 08/26/2010   CL 105 08/26/2010   CO2 26 08/26/2010   Lab Results  Component Value Date   ALT 16 08/24/2010   AST 22 08/24/2010   ALKPHOS 99 08/24/2010   BILITOT 0.1* 08/24/2010     RADIOGRAPHY: Mr Jeri Cos Wo Contrast  05/25/2014   CLINICAL DATA:  Recurrent meningioma. Previous resection in 2012. SRS planning. Subsequent encounter.  EXAM: MRI HEAD WITHOUT AND WITH CONTRAST  TECHNIQUE: Multiplanar, multiecho pulse sequences of the brain and surrounding structures were obtained without and with intravenous contrast.  CONTRAST:  MultiHance 17 mL.  COMPARISON:  Multiple priors, most recent 03/30/2014.  FINDINGS: Redemonstrated is a homogeneously enhancing extra-axial mass with its epicenter in the RIGHT frontal sinus. It lies superficial to the area of encephalomalacia where the bulk of the meningioma was previously removed in 2012. This has progressed from the previously noted small area of enhancement within the surgical bed in 2013 and is consistent with a recurrent meningioma. No change from earlier scan in February. Dimensions today are 30 x 13 x 21 mm (R-L x A-P x C-C).  No acute stroke or acute hemorrhage. No intra-axial mass lesion or hydrocephalus. No extra-axial fluid. Normal cerebral volume. Minor white matter disease primarily in the RIGHT frontal lobe related to compression by previous tumor. Flow voids are maintained. Normal midline structures. No other abnormal areas of enhancement.  IMPRESSION: Recurrent RIGHT frontal meningioma measuring 30 x 13 x 21 mm. See discussion above.   Electronically Signed   By: Rolla Flatten M.D.   On: 05/25/2014 17:07      IMPRESSION: This patient is a very nice 39 year old woman with recurrent grade 1 meningioma involving the right frontal convexity with invasion into the right frontal sinus  and frontal bone. At this point, she is eligible for a variety of potential treatment options. Surgical salvage in this setting would involve violation of the right frontal sinus which could increase her risk for surgical complications including but not limited to CSF leak. After discussing these surgical benefits and risks with neurosurgery, the patient expressed some increased interested in stereotactic radiosurgery. Based on these radiographically findings, patient does appear to be a good candidate for stereotactic radiosurgery, potentially using a fractionated treatment approach given the size of the tumor and proximity to the optic structures.  PLAN:Today, I talked to the patient and family about the findings and work-up thus far.  We discussed the natural history of recurrent meningioma and general treatment, highlighting the role of stereotactic radiotherapy in the management.  We discussed the available radiation techniques, and focused on the details of logistics and delivery.  We reviewed the anticipated acute and late sequelae associated with radiation in this setting.  The patient was encouraged to ask questions that I answered to the best of my ability.  I filled out a patient counseling form during  our discussion including treatment diagrams.  We retained a copy for our records.  The patient would like to proceed with radiation and will be scheduled for CT simulation.  I spent 60 minutes minutes face to face with the patient and more than 50% of that time was spent in counseling and/or coordination of care.     ------------------------------------------------  Sheral Apley. Tammi Klippel, M.D.

## 2014-05-29 ENCOUNTER — Encounter: Payer: Self-pay | Admitting: Radiation Oncology

## 2014-05-29 ENCOUNTER — Ambulatory Visit
Admission: RE | Admit: 2014-05-29 | Discharge: 2014-05-29 | Disposition: A | Discharge status: Home / Self Care | Payer: 59 | Source: Ambulatory Visit | Attending: Radiation Oncology | Admitting: Radiation Oncology

## 2014-05-29 ENCOUNTER — Other Ambulatory Visit: Payer: Self-pay | Admitting: Radiation Oncology

## 2014-05-29 VITALS — BP 138/86 | HR 103 | Temp 98.0°F | Resp 16 | Ht 64.0 in | Wt 186.0 lb

## 2014-05-29 DIAGNOSIS — Z510 Encounter for antineoplastic radiation therapy: Secondary | ICD-10-CM | POA: Insufficient documentation

## 2014-05-29 DIAGNOSIS — D320 Benign neoplasm of cerebral meninges: Secondary | ICD-10-CM | POA: Diagnosis not present

## 2014-05-29 NOTE — Progress Notes (Signed)
See progress note under physician encounter. 

## 2014-05-29 NOTE — Progress Notes (Addendum)
Location/Histology of Brain Tumor: recurrent right frontal lobe meningioma  Past or anticipated interventions, if any, per neurosurgery: "not keen on further surgical intervention"  Past or anticipated interventions, if any, per medical oncology: no  Dose of Decadron, if applicable: no  Recent neurologic symptoms, if any:   Seizures: no  Headaches: occasional migraines  Nausea: no  Dizziness/ataxia: no  Difficulty with hand coordination: no  Focal numbness/weakness: numbness in 4th finger of right hand  Visual deficits/changes: intermittent floater in left eye  Confusion/Memory deficits: no  Painful bone metastases at present, if any: no SAFETY ISSUES:  Prior radiation? no  Pacemaker/ICD? no  Possible current pregnancy? no  Is the patient on methotrexate? no  Additional Complaints / other details: 39 year old right handed African American female. Craniotomy from tumor resection done 08/26/2010. Original tumor size 4 cm. Mother passed from pancreatic cancer. Married with two children, ages 69 and 54.. NKDA.  To follow up with Dr. Ellene Route in June.

## 2014-06-01 ENCOUNTER — Telehealth: Payer: Self-pay | Admitting: Radiation Oncology

## 2014-06-01 NOTE — Telephone Encounter (Signed)
Placed orange folder with complete FMLA paperwork in Dr. Johny Shears desk for Dr. Tammi Klippel to sign.

## 2014-06-02 ENCOUNTER — Ambulatory Visit
Admission: RE | Admit: 2014-06-02 | Discharge: 2014-06-02 | Disposition: A | Discharge status: Home / Self Care | Payer: 59 | Source: Ambulatory Visit | Attending: Radiation Oncology | Admitting: Radiation Oncology

## 2014-06-02 ENCOUNTER — Encounter: Payer: Self-pay | Admitting: *Deleted

## 2014-06-02 DIAGNOSIS — Z510 Encounter for antineoplastic radiation therapy: Secondary | ICD-10-CM | POA: Diagnosis not present

## 2014-06-02 DIAGNOSIS — D320 Benign neoplasm of cerebral meninges: Secondary | ICD-10-CM

## 2014-06-02 MED ORDER — SODIUM CHLORIDE 0.9 % IJ SOLN
10.0000 mL | Freq: Once | INTRAMUSCULAR | Status: AC
  Administered 2014-06-02: 10 mL via INTRAVENOUS

## 2014-06-02 NOTE — Progress Notes (Signed)
Right AC 22 gauge IV removed by [facility] staff.

## 2014-06-02 NOTE — Addendum Note (Signed)
Encounter addended by: Heywood Footman, RN on: 06/02/2014  4:24 PM<BR>     Documentation filed: Vitals Section

## 2014-06-02 NOTE — Progress Notes (Signed)
  Radiation Oncology         [phone] ________________________________  Name: Teresa Liu MRN: [id number]  Date: 06/02/2014  DOB: 1975-05-04  SIMULATION AND TREATMENT PLANNING NOTE    ICD-9-CM ICD-10-CM   1. 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  Identity was confirmed.  All relevant records and images related to the planned course of therapy were reviewed.  The patient freely provided informed written consent to proceed with treatment after reviewing the details related to the planned course of therapy. The consent form was witnessed and verified by the simulation staff. Intravenous access was established for contrast administration. Then, the patient was set-up in a stable reproducible supine position for radiation therapy.  A relocatable thermoplastic stereotactic head frame was fabricated for precise immobilization.  CT images were obtained.  Surface markings were placed.  The CT images were loaded into the planning software and fused with the patient's targeting MRI scan.  Then the target and avoidance structures were contoured.  Treatment planning then occurred.  The radiation prescription was entered and confirmed.  I have requested 3D planning  I have requested a DVH of the following structures: Brain stem, brain, left eye, right eye, lenses, optic chiasm, target volumes, uninvolved brain, and normal tissue.    PLAN:  The patient will receive 25 Gy in 5 fractions.   This document serves as a record of services personally performed by Tyler Pita, MD. It was created on his behalf by Pearlie Oyster, a trained medical scribe. The creation of this record is based on the scribe's personal observations and the provider's statements to them. This document has been checked and approved by the attending provider.      ________________________________  Sheral Apley. Tammi Klippel, M.D.

## 2014-06-02 NOTE — Progress Notes (Signed)
[place] Psychosocial Distress Screening Clinical Social Work  Clinical Social Work was referred by distress screening protocol.  The patient scored a 7 on the Psychosocial Distress Thermometer which indicates severe distress. Clinical Social Worker attempted to meet with pt at [facility] SIM to assess for distress and other psychosocial needs. CSW met with pt and spouse briefly to introduce self and explain role of CSW and Pt and Family Support Team. CSW provided handouts and information on Support Services, Brain Group and CSW team. Pt then had to go to [facility] and Pt agreed for CSW to follow up via phone or meet at future appointments.   ONCBCN DISTRESS SCREENING 05/29/2014  Screening Type Initial Screening  Distress experienced in past week (1-10) 7  Practical problem type Work/school  Emotional problem type Adjusting to illness;Adjusting to appearance changes  Physical Problem type Tingling hands/feet  Physician notified of physical symptoms Yes  Referral to clinical psychology No  Referral to clinical social work Yes  Referral to dietition No  Referral to financial advocate No  Referral to support programs No  Referral to palliative care No  Other work, weight gain biggest concerns; may be reached via phone    Clinical Social Worker follow up needed: Yes.    If yes, follow up plan: See above Loren Racer, [place] Worker [place]  [facility] Phone: [phone] Fax: [phone]

## 2014-06-02 NOTE — Progress Notes (Signed)
Received patient in the clinic today for IV start necessary for CT/SIM. Weight and vitals stable. Patient alert and oriented x3. Patient without complaints. 05/25/2014 BUN 9.5 and creatinine 0.7. Both WDL. Patient denies being diabetic or taking metformin. Denies allergy to contrast. IV started in right AC 22 gauge. Patient tolerated well. Excellent blood return obtained and site flushed without difficulty. Escorted patient to CT.

## 2014-06-09 ENCOUNTER — Ambulatory Visit: Payer: 59

## 2014-06-09 DIAGNOSIS — Z510 Encounter for antineoplastic radiation therapy: Secondary | ICD-10-CM | POA: Diagnosis not present

## 2014-06-12 ENCOUNTER — Ambulatory Visit: Payer: 59 | Admitting: Radiation Oncology

## 2014-06-13 DIAGNOSIS — Z510 Encounter for antineoplastic radiation therapy: Secondary | ICD-10-CM | POA: Diagnosis not present

## 2014-06-14 ENCOUNTER — Ambulatory Visit: Payer: 59 | Admitting: Radiation Oncology

## 2014-06-14 ENCOUNTER — Telehealth: Payer: Self-pay | Admitting: Radiation Oncology

## 2014-06-14 NOTE — Telephone Encounter (Signed)
Returned signed and complete FMLA paperwork to [employer].

## 2014-06-15 ENCOUNTER — Encounter: Payer: Self-pay | Admitting: Radiation Oncology

## 2014-06-15 NOTE — Progress Notes (Signed)
Faxed [employer] FMLA paperwork to [phone]

## 2014-06-16 ENCOUNTER — Ambulatory Visit: Payer: 59 | Admitting: Radiation Oncology

## 2014-06-19 ENCOUNTER — Ambulatory Visit
Admission: RE | Admit: 2014-06-19 | Discharge: 2014-06-19 | Disposition: A | Discharge status: Home / Self Care | Payer: 59 | Source: Ambulatory Visit | Attending: Radiation Oncology | Admitting: Radiation Oncology

## 2014-06-19 ENCOUNTER — Encounter: Payer: Self-pay | Admitting: Radiation Oncology

## 2014-06-19 ENCOUNTER — Ambulatory Visit: Payer: 59 | Admitting: Radiation Oncology

## 2014-06-19 VITALS — BP 137/73 | HR 78 | Temp 98.4°F | Resp 16

## 2014-06-19 DIAGNOSIS — D320 Benign neoplasm of cerebral meninges: Secondary | ICD-10-CM

## 2014-06-19 DIAGNOSIS — Z510 Encounter for antineoplastic radiation therapy: Secondary | ICD-10-CM | POA: Diagnosis not present

## 2014-06-19 NOTE — Progress Notes (Signed)
  Name: Teresa Liu  MRN: [id number]  Date: 06/19/2014   DOB: 1975/04/24  Stereotactic Radiosurgery Operative Note  PRE-OPERATIVE DIAGNOSIS:  Solitary Brain Meningioma  POST-OPERATIVE DIAGNOSIS:  Solitary Brain Meningioma  PROCEDURE:  Stereotactic Radiosurgery  SURGEON:  Earleen Newport, MD  NARRATIVE: The patient underwent a radiation treatment planning session in the radiation oncology simulation suite under the care of the radiation oncology physician and physicist.  I participated closely in the radiation treatment planning afterwards. The patient underwent planning CT which was fused to 3T high resolution MRI with 1 mm axial slices.  These images were fused on the planning system.  We contoured the gross target volumes and subsequently expanded this to yield the Planning Target Volume. I actively participated in the planning process.  I helped to define and review the target contours and also the contours of the optic pathway, eyes, brainstem and selected nearby organs at risk.  All the dose constraints for critical structures were reviewed and compared to AAPM Task Group 101.  The prescription dose conformity was reviewed.  I approved the plan electronically.    Accordingly, [name] was brought to the TrueBeam stereotactic radiation treatment linac and placed in the custom immobilization mask.  The patient was aligned according to the IR fiducial markers with BrainLab Exactrac, then orthogonal x-rays were used in ExacTrac with the 6DOF robotic table and the shifts were made to align the patient  [name] received stereotactic radiosurgery uneventfully.    The detailed description of the procedure is recorded in the radiation oncology procedure note.  I was present for the duration of the procedure.  DISPOSITION:  Following delivery, the patient was transported to nursing in stable condition and monitored for possible acute effects to be discharged to home in stable  condition with follow-up in one month.  Earleen Newport, MD 06/19/2014 1:30 PM

## 2014-06-19 NOTE — Progress Notes (Signed)
  Radiation Oncology         [phone]) [phone] ________________________________  Stereotactic Treatment Procedure Note  Name: Teresa Liu MRN: [id number]  Date: 06/19/2014  DOB: [birth year]  SPECIAL TREATMENT PROCEDURE    ICD-9-CM ICD-10-CM   1. 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  It showed 3-dimensional radiation distributions overlaid onto the planning CT/MRI image set.  The [facility] for the target structures as well as the organs at risk were reviewed. The documentation of the 3D plan and dosimetry are filed in the radiation oncology EMR.  NARRATIVE:  Teresa Liu was brought to the TrueBeam stereotactic radiation treatment machine and placed supine on the CT couch. The head frame was applied, and the patient was set up for stereotactic radiosurgery.  Neurosurgery was present for the set-up and delivery  SIMULATION VERIFICATION:  In the couch zero-angle position, the patient underwent Exactrac imaging using the Brainlab system with orthogonal KV images.  These were carefully aligned and repeated to confirm treatment position for each of the isocenters.  The Exactrac snap film verification was repeated at each couch angle.  SPECIAL TREATMENT PROCEDURE: Teresa Liu received stereotactic radiosurgery to the following targets: Right frontal 3 cm target was treated using 4 Rapid Arc VMAT Beams to a fractional prescription dose of 5 Gy. Which will be continued for a total of 5 fractions to achieve a total Rx dose of 25 Gy.  ExacTrac registration was performed for each couch angle.  The 100% isodose line was prescribed.  6 MV X-rays were delivered in the flattening filter free beam mode.  STEREOTACTIC TREATMENT MANAGEMENT:  Following delivery, the patient was transported to nursing in stable condition and  monitored for possible acute effects.  Vital signs were recorded. BP 137/73 mmHg  Pulse 78  Temp(Src) 98.4 F (36.9 C) (Oral)  Resp 16  SpO2 100% The patient tolerated treatment without significant acute effects, and was discharged to home in stable condition.    PLAN: Follow-up in one month.  This document serves as a record of services personally performed by Tyler Pita, MD. It was created on his behalf by Darcus Austin, a trained medical scribe. The creation of this record is based on the scribe's personal observations and the provider's statements to them. This document has been checked and approved by the attending provider.     ________________________________  Sheral Apley. Tammi Klippel, M.D.

## 2014-06-19 NOTE — Progress Notes (Addendum)
Vitals stable. Denies pain. Denies headache, dizziness, nausea, vomiting, diplopia or ringing in the ears. Denies taking decadron or keppra. Understands to avoid strenuous activities for the next 24 hours. Also, understands to call [phone] with future needs. Will discharge patient home with husband in approximately ten minutes.

## 2014-06-19 NOTE — Progress Notes (Signed)
Patient without complaints. Patient ambulated to lobby for discharge home with husband. Understands to call [phone] with needs. Appointment calendar given. Patient understands to return Wednesday for second treatment.

## 2014-06-21 ENCOUNTER — Ambulatory Visit
Admission: RE | Admit: 2014-06-21 | Discharge: 2014-06-21 | Disposition: A | Discharge status: Home / Self Care | Payer: 59 | Source: Ambulatory Visit | Attending: Radiation Oncology | Admitting: Radiation Oncology

## 2014-06-21 VITALS — BP 135/78 | HR 86 | Temp 98.2°F | Resp 16

## 2014-06-21 DIAGNOSIS — Z510 Encounter for antineoplastic radiation therapy: Secondary | ICD-10-CM | POA: Diagnosis not present

## 2014-06-21 DIAGNOSIS — D320 Benign neoplasm of cerebral meninges: Secondary | ICD-10-CM

## 2014-06-21 NOTE — Progress Notes (Signed)
Discharged patient home with her husband following SRS treatment. Denies headache, dizziness, nausea, diplopia or ringing in the ears. Reports she experienced a mild headache following her initial srs treatment. Understands to rest for the next 24 hour and contact this RN with needs.

## 2014-06-21 NOTE — Progress Notes (Signed)
  Radiation Oncology         [phone]) [phone] ________________________________  Stereotactic Treatment Procedure Note  Name: Teresa Liu MRN: [id number]  Date: 06/21/2014  DOB: 1975/07/06  SPECIAL TREATMENT PROCEDURE  Diagnosis:     ICD-9-CM ICD-10-CM   1. 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  It showed 3-dimensional radiation distributions overlaid onto the planning CT/MRI image set.  The [facility] for the target structures as well as the organs at risk were reviewed. The documentation of the 3D plan and dosimetry are filed in the radiation oncology EMR.  NARRATIVE:  Teresa Liu was brought to the TrueBeam stereotactic radiation treatment machine and placed supine on the CT couch. The head frame was applied, and the patient was set up for stereotactic radiosurgery.  Neurosurgery was present for the set-up and delivery  SIMULATION VERIFICATION:  In the couch zero-angle position, the patient underwent Exactrac imaging using the Brainlab system with orthogonal KV images.  These were carefully aligned and repeated to confirm treatment position for each of the isocenters.  The Exactrac snap film verification was repeated at each couch angle.  SPECIAL TREATMENT PROCEDURE: Teresa Liu received stereotactic radiosurgery to the following targets: Right frontal 3 cm target was treated using 4 Rapid Arc VMAT Beams to a fractional prescription dose of 5 Gy. Which will be continued for a total of 5 fractions to achieve a total Rx dose of 25 Gy.  ExacTrac registration was performed for each couch angle.  The 100% isodose line was prescribed.  6 MV X-rays were delivered in the flattening filter free beam mode.  STEREOTACTIC TREATMENT MANAGEMENT:  Following delivery,  the patient was transported to nursing in stable condition and monitored for possible acute effects.  Vital signs were recorded. BP 135/78 mmHg  Pulse 86  Temp(Src) 98.2 F (36.8 C) (Oral)  Resp 16  SpO2 100% The patient tolerated treatment without significant acute effects, and was discharged to home in stable condition.    PLAN: Follow-up in one month.  This document serves as a record of services personally performed by Tyler Pita, MD. It was created on his behalf by Arlyce Harman, a trained medical scribe. The creation of this record is based on the scribe's personal observations and the provider's statements to them. This document has been checked and approved by the attending provider.       ________________________________  Sheral Apley. Tammi Klippel, M.D.

## 2014-06-23 ENCOUNTER — Ambulatory Visit
Admission: RE | Admit: 2014-06-23 | Discharge: 2014-06-23 | Disposition: A | Discharge status: Home / Self Care | Payer: 59 | Source: Ambulatory Visit | Attending: Radiation Oncology | Admitting: Radiation Oncology

## 2014-06-23 VITALS — BP 137/92 | HR 86 | Temp 98.7°F | Resp 16

## 2014-06-23 DIAGNOSIS — Z510 Encounter for antineoplastic radiation therapy: Secondary | ICD-10-CM | POA: Diagnosis not present

## 2014-06-23 DIAGNOSIS — D320 Benign neoplasm of cerebral meninges: Secondary | ICD-10-CM

## 2014-06-23 NOTE — Progress Notes (Signed)
Nurse monitoring following srs treatment complete. Patient alert and oriented x 3. Patient without complaints. Denies headache, dizziness, nausea, vomiting, diplopia or ringing in the ears. BP slightly elevated. Steady gait noted. Understands to avoid strenuous activity for the next 24 hours and call [phone] with needs. Also, understands to return on Monday for 4th of 5th treatment.

## 2014-06-23 NOTE — Progress Notes (Signed)
  Radiation Oncology         [phone]) [phone] ________________________________  Stereotactic Treatment Procedure Note  Name: Teresa Liu MRN: [id number]  Date: 06/23/2014  DOB: Jun 14, 1975  SPECIAL TREATMENT PROCEDURE  Diagnosis:     ICD-9-CM ICD-10-CM   1. 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  It showed 3-dimensional radiation distributions overlaid onto the planning CT/MRI image set.  The [facility] for the target structures as well as the organs at risk were reviewed. The documentation of the 3D plan and dosimetry are filed in the radiation oncology EMR.  NARRATIVE:  Teresa Liu was brought to the TrueBeam stereotactic radiation treatment machine and placed supine on the CT couch. The head frame was applied, and the patient was set up for stereotactic radiosurgery.  Neurosurgery was present for the set-up and delivery  SIMULATION VERIFICATION:  In the couch zero-angle position, the patient underwent Exactrac imaging using the Brainlab system with orthogonal KV images.  These were carefully aligned and repeated to confirm treatment position for each of the isocenters.  The Exactrac snap film verification was repeated at each couch angle.  SPECIAL TREATMENT PROCEDURE: Teresa Liu received stereotactic radiosurgery to the following targets: Right frontal 3 cm target was treated using 4 Rapid Arc VMAT Beams to a fractional prescription dose of 5 Gy. Which will be continued for a total of 5 fractions to achieve a total Rx dose of 25 Gy.  ExacTrac registration was performed for each couch angle.  The 100% isodose line was prescribed.  6 MV X-rays were delivered in the flattening filter free beam mode.  STEREOTACTIC TREATMENT MANAGEMENT:  Following delivery,  the patient was transported to nursing in stable condition and monitored for possible acute effects.  Vital signs were recorded. BP 137/92 mmHg  Pulse 86  Temp(Src) 98.7 F (37.1 C) (Oral)  Resp 16  SpO2 100% The patient tolerated treatment without significant acute effects, and was discharged to home in stable condition.    PLAN: Continue for 5 fractions, then follow-up in one month.  This document serves as a record of services personally performed by Tyler Pita, MD. It was created on his behalf by Darcus Austin, a trained medical scribe. The creation of this record is based on the scribe's personal observations and the provider's statements to them. This document has been checked and approved by the attending provider.         ________________________________  Sheral Apley. Tammi Klippel, M.D.

## 2014-06-26 ENCOUNTER — Ambulatory Visit
Admission: RE | Admit: 2014-06-26 | Discharge: 2014-06-26 | Disposition: A | Discharge status: Home / Self Care | Payer: 59 | Source: Ambulatory Visit | Attending: Radiation Oncology | Admitting: Radiation Oncology

## 2014-06-26 ENCOUNTER — Encounter: Payer: Self-pay | Admitting: Radiation Oncology

## 2014-06-26 ENCOUNTER — Telehealth: Payer: Self-pay | Admitting: Radiation Oncology

## 2014-06-26 VITALS — BP 135/72 | HR 84 | Temp 98.8°F | Resp 16

## 2014-06-26 DIAGNOSIS — Z510 Encounter for antineoplastic radiation therapy: Secondary | ICD-10-CM | POA: Diagnosis not present

## 2014-06-26 DIAGNOSIS — D320 Benign neoplasm of cerebral meninges: Secondary | ICD-10-CM

## 2014-06-26 NOTE — Telephone Encounter (Signed)
Returned message left by Wynn Banker requesting clarification about treatment appointments and follow up. Returned call. No answer. Left message explaining the patient is receiving five total SRS treatments and will require a one month following up then, a follow up every three months with potential scans.

## 2014-06-26 NOTE — Progress Notes (Addendum)
Patient here for post [facility] monitoring for 15 minutes.  She is alert and oriented to person, place and time.  She denies pain, headaches, dizziness and nausea.  She reports having blurred vision after the mask was removed for a short time after her last [facility] treatment.  She also reports having a gas pain in her stomach after her last treatment on Friday and then saw a small amount of dark blood after she urinated.  She thinks the blood was the end of her menstrual period.  Dr. Tammi Klippel notified of temporary blurry vision with last treatment.

## 2014-06-26 NOTE — Progress Notes (Signed)
  Radiation Oncology         [phone]) [phone] ________________________________  Stereotactic Treatment Procedure Note  Name: Teresa Liu MRN: [id number]  Date: 06/28/2014  DOB: 1975-12-08  SPECIAL TREATMENT PROCEDURE  Diagnosis:     ICD-9-CM ICD-10-CM   1. 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  It showed 3-dimensional radiation distributions overlaid onto the planning CT/MRI image set.  [facility] for the target structures as well as the organs at risk were reviewed. The documentation of the 3D plan and dosimetry are filed in the radiation oncology EMR.  NARRATIVE:  Teresa Liu was brought to the TrueBeam stereotactic radiation treatment machine and placed supine on the CT couch. The head frame was applied, and the patient was set up for stereotactic radiosurgery.  Neurosurgery was present for the set-up and delivery  SIMULATION VERIFICATION:  In the couch zero-angle position, the patient underwent Exactrac imaging using the Brainlab system with orthogonal KV images.  These were carefully aligned and repeated to confirm treatment position for each of the isocenters.  The Exactrac snap film verification was repeated at each couch angle.  SPECIAL TREATMENT PROCEDURE: Teresa Liu received stereotactic radiosurgery to the following targets: Right frontal 3 cm target was treated using 4 Rapid Arc VMAT Beams to a fractional prescription dose of 5 Gy. Which will be continued for a total of 5 fractions to achieve a total Rx dose of 25 Gy.  ExacTrac registration was performed for each couch angle.  The 100% isodose line was prescribed.  6 MV X-rays were delivered in the flattening filter free beam mode.  STEREOTACTIC TREATMENT MANAGEMENT:  Following delivery, the  patient was transported to nursing in stable condition and monitored for possible acute effects.  Vital signs were recorded. BP 126/73 mmHg  Pulse 69  Temp(Src) 97.6 F (36.4 C) (Oral)  Resp 18  SpO2 100% The patient tolerated treatment without significant acute effects, and was discharged to home in stable condition.    PLAN: Complete 5 fractions today.  Follow-up in one month.   ________________________________  Teresa Liu. Tammi Klippel, M.D.  This document serves as a record of services personally performed by Tyler Pita, MD. It was created on his behalf by Derek Mound, a trained medical scribe. The creation of this record is based on the scribe's personal observations and the provider's statements to them. This document has been checked and approved by the attending provider.

## 2014-06-26 NOTE — Progress Notes (Signed)
  Radiation Oncology         [phone]) [phone] ________________________________  Stereotactic Treatment Procedure Note  Name: Teresa Liu MRN: [id number]  Date: 06/26/2014  DOB: 03/14/1975  SPECIAL TREATMENT PROCEDURE  Diagnosis:     ICD-9-CM ICD-10-CM   1. 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  It showed 3-dimensional radiation distributions overlaid onto the planning CT/MRI image set.  [facility] for the target structures as well as the organs at risk were reviewed. The documentation of the 3D plan and dosimetry are filed in the radiation oncology EMR.  NARRATIVE:  Teresa Liu was brought to the TrueBeam stereotactic radiation treatment machine and placed supine on the CT couch. The head frame was applied, and the patient was set up for stereotactic radiosurgery.  Neurosurgery was present for the set-up and delivery  SIMULATION VERIFICATION:  In the couch zero-angle position, the patient underwent Exactrac imaging using the Brainlab system with orthogonal KV images.  These were carefully aligned and repeated to confirm treatment position for each of the isocenters.  The Exactrac snap film verification was repeated at each couch angle.  SPECIAL TREATMENT PROCEDURE: Teresa Liu received stereotactic radiosurgery to the following targets: Right frontal 3 cm target was treated using 4 Rapid Arc VMAT Beams to a fractional prescription dose of 5 Gy. Which will be continued for a total of 5 fractions to achieve a total Rx dose of 25 Gy.  ExacTrac registration was performed for each couch angle.  The 100% isodose line was prescribed.  6 MV X-rays were delivered in the flattening filter free beam mode.  STEREOTACTIC TREATMENT MANAGEMENT:  Following delivery, the  patient was transported to nursing in stable condition and monitored for possible acute effects.  Vital signs were recorded. BP 135/72 mmHg  Pulse 84  Temp(Src) 98.8 F (37.1 C) (Oral)  Resp 16  SpO2 100% The patient tolerated treatment without significant acute effects, and was discharged to home in stable condition.    PLAN: Continue for 5 fractions, then follow-up in one month.   ________________________________  Sheral Apley. Tammi Klippel, M.D.  This document serves as a record of services personally performed by Tyler Pita, MD. It was created on his behalf by Derek Mound, a trained medical scribe. The creation of this record is based on the scribe's personal observations and the provider's statements to them. This document has been checked and approved by the attending provider.

## 2014-06-28 ENCOUNTER — Ambulatory Visit
Admission: RE | Admit: 2014-06-28 | Discharge: 2014-06-28 | Disposition: A | Discharge status: Home / Self Care | Payer: 59 | Source: Ambulatory Visit | Attending: Radiation Oncology | Admitting: Radiation Oncology

## 2014-06-28 ENCOUNTER — Encounter: Payer: Self-pay | Admitting: Radiation Oncology

## 2014-06-28 VITALS — BP 126/73 | HR 69 | Temp 97.6°F | Resp 18

## 2014-06-28 DIAGNOSIS — D320 Benign neoplasm of cerebral meninges: Secondary | ICD-10-CM

## 2014-06-28 DIAGNOSIS — Z510 Encounter for antineoplastic radiation therapy: Secondary | ICD-10-CM | POA: Diagnosis not present

## 2014-06-28 NOTE — Progress Notes (Signed)
Nurse monitoring following srs treatment complete. Vitals stable. Denies pain. Denies headache, dizziness, diplopia, ringing in the ears, nausea or vomiting. Reports blurry vision has resolved. One month follow up appointment card given. Understands to contact [phone] with needs. Understands to avoid strenuous activity for the next 24 hours.

## 2014-07-16 NOTE — Progress Notes (Signed)
  Radiation Oncology         [phone] ________________________________  Name: Teresa Liu MRN: [id number]  Date: 06/28/2014  DOB: 07-27-1975  End of Treatment Note  Diagnosis:   39 yo woman with recurrent 30 mm right frontal convexity meningioma- D32.0  Indication for treatment:  Curative, Local Control       Radiation treatment dates:   06/19/2014, 06/21/2014, 06/23/2014, 06/26/2014, 06/28/2014  Site/dose/beams/energy:   Right frontal 3 cm target was treated using 4 Rapid Arc VMAT Beams to a fractional prescription dose of 5 Gy for a total of 5 fractions to achieve a total Rx dose of 25 Gy. ExacTrac registration was performed for each couch angle. The 100% isodose line was prescribed. 6 MV X-rays were delivered in the flattening filter free beam mode.  Narrative: The patient tolerated radiation treatment relatively well.    Plan: The patient has completed radiation treatment. The patient will return to radiation oncology clinic for routine followup in one month. I advised her to call or return sooner if she has any questions or concerns related to her recovery or treatment. ________________________________  Sheral Apley. Tammi Klippel, M.D.

## 2014-07-31 ENCOUNTER — Encounter: Payer: Self-pay | Admitting: Radiation Oncology

## 2014-07-31 ENCOUNTER — Ambulatory Visit
Admission: RE | Admit: 2014-07-31 | Discharge: 2014-07-31 | Disposition: A | Discharge status: Home / Self Care | Payer: 59 | Source: Ambulatory Visit | Attending: Radiation Oncology | Admitting: Radiation Oncology

## 2014-07-31 VITALS — BP 136/72 | HR 97 | Resp 16 | Wt 185.1 lb

## 2014-07-31 DIAGNOSIS — D320 Benign neoplasm of cerebral meninges: Secondary | ICD-10-CM

## 2014-07-31 NOTE — Progress Notes (Signed)
  Radiation Oncology         [phone] ________________________________  Name: Teresa Liu MRN: [id number]  Date: 07/31/2014  DOB: [birth year]     Follow-Up Visit Note  CC: Osborne Casco, MD  Kristeen Miss, MD  Diagnosis:   39 yo woman with recurrent 30 mm right frontal convexity meningioma- D32.0    ICD-9-CM ICD-10-CM   1. 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 Denies pain. Reports occasional migraines continue but, have not increased in frequency or intensity. Denies nausea, vomiting, dizziness, diplopia or ringing in the ears.  The patient denies any visual changes or ocular symptoms.    ALLERGIES:  has No Known Allergies.  Meds: Current Outpatient Prescriptions  Medication Sig Dispense Refill  . FLURBIPROFEN PO Take by mouth.    . naratriptan (AMERGE) 1 MG TABS tablet Take 1 mg by mouth once as needed. Take one (1) tablet at onset of headache; if returns or does not resolve, may repeat after 4 hours; do not exceed five (5) mg in 24 hours.    Marland Kitchen NUVARING 0.12-0.015 MG/24HR vaginal ring   4  . verapamil (CALAN) 40 MG tablet Take 40 mg by mouth 3 (three) times daily.     No current facility-administered medications for this encounter.    Physical Findings: The patient is in no acute distress. Patient is alert and oriented.  weight is 185 lb 1.6 oz (83.961 kg). Her blood pressure is 136/72 and her pulse is 97. Her respiration is 16. .  She has mild epilation of right eye brow and minimal hyperpigmentation of central forehead.  No significant changes.  Lab Findings: Lab Results  Component Value Date   WBC 23.4* 08/26/2010   HGB 12.4 08/26/2010   HCT 37.6 08/26/2010   PLT 379 08/26/2010    Lab Results  Component Value Date   NA 140 08/26/2010   K 4.3 08/26/2010   CO2 26 08/26/2010   GLUCOSE 203* 08/26/2010   BUN 9.5  05/25/2014   BUN 17 08/26/2010   CREATININE 0.7 05/25/2014   CREATININE 0.66 08/26/2010   BILITOT 0.1* 08/24/2010   ALKPHOS 99 08/24/2010   AST 22 08/24/2010   ALT 16 08/24/2010   PROT 7.0 08/24/2010   ALBUMIN 3.0* 08/24/2010   CALCIUM 9.1 08/26/2010    Radiographic Findings: No results found.  Impression:  The patient is recovering from the effects of radiation.    Plan: Baseline MRI in 2 months.   _____________________________________  Sheral Apley. Tammi Klippel, M.D.   This document serves as a record of services personally performed by Tyler Pita, MD. It was created on his behalf by Derek Mound, a trained medical scribe. The creation of this record is based on the scribe's personal observations and the provider's statements to them. This document has been checked and approved by the attending provider.

## 2014-07-31 NOTE — Progress Notes (Signed)
Weight and vitals stable. Denies pain. Reports occasional migraines continue but, have not increased in frequency or intensity. Denies nausea, vomiting, dizziness, diplopia or ringing in the ears.   BP 136/72 mmHg  Pulse 97  Resp 16  Wt 185 lb 1.6 oz (83.961 kg) Wt Readings from Last 3 Encounters:  07/31/14 185 lb 1.6 oz (83.961 kg)  05/25/14 185 lb (83.915 kg)

## 2014-08-31 ENCOUNTER — Other Ambulatory Visit: Payer: Self-pay | Admitting: Neurological Surgery

## 2014-08-31 DIAGNOSIS — D329 Benign neoplasm of meninges, unspecified: Secondary | ICD-10-CM

## 2014-09-25 ENCOUNTER — Ambulatory Visit
Admission: RE | Admit: 2014-09-25 | Discharge: 2014-09-25 | Disposition: A | Discharge status: Home / Self Care | Payer: 59 | Source: Ambulatory Visit | Attending: Neurological Surgery | Admitting: Neurological Surgery

## 2014-09-25 DIAGNOSIS — D329 Benign neoplasm of meninges, unspecified: Secondary | ICD-10-CM

## 2014-09-25 MED ORDER — GADOBENATE DIMEGLUMINE 529 MG/ML IV SOLN
15.0000 mL | Freq: Once | INTRAVENOUS | Status: AC | PRN
  Administered 2014-09-25: 15 mL via INTRAVENOUS

## 2015-03-13 ENCOUNTER — Other Ambulatory Visit (HOSPITAL_COMMUNITY): Payer: Self-pay | Admitting: Physician Assistant

## 2015-03-13 DIAGNOSIS — R002 Palpitations: Secondary | ICD-10-CM

## 2015-03-14 DIAGNOSIS — R002 Palpitations: Secondary | ICD-10-CM | POA: Insufficient documentation

## 2015-03-23 ENCOUNTER — Ambulatory Visit (HOSPITAL_COMMUNITY): Payer: 59 | Attending: Internal Medicine

## 2015-03-23 ENCOUNTER — Ambulatory Visit (INDEPENDENT_AMBULATORY_CARE_PROVIDER_SITE_OTHER): Payer: 59

## 2015-03-23 ENCOUNTER — Other Ambulatory Visit: Payer: Self-pay

## 2015-03-23 DIAGNOSIS — R002 Palpitations: Secondary | ICD-10-CM

## 2015-03-28 ENCOUNTER — Other Ambulatory Visit: Payer: Self-pay | Admitting: Radiation Therapy

## 2015-03-28 DIAGNOSIS — D320 Benign neoplasm of cerebral meninges: Secondary | ICD-10-CM

## 2015-04-19 ENCOUNTER — Ambulatory Visit: Payer: Self-pay | Admitting: Radiation Oncology

## 2015-05-04 ENCOUNTER — Encounter: Payer: Self-pay | Admitting: Cardiovascular Disease

## 2015-05-04 ENCOUNTER — Ambulatory Visit (INDEPENDENT_AMBULATORY_CARE_PROVIDER_SITE_OTHER): Payer: 59 | Admitting: Cardiovascular Disease

## 2015-05-04 VITALS — BP 120/94 | HR 94 | Ht 64.0 in | Wt 186.0 lb

## 2015-05-04 DIAGNOSIS — R0609 Other forms of dyspnea: Secondary | ICD-10-CM

## 2015-05-04 DIAGNOSIS — R002 Palpitations: Secondary | ICD-10-CM | POA: Diagnosis not present

## 2015-05-04 DIAGNOSIS — R0600 Dyspnea, unspecified: Secondary | ICD-10-CM

## 2015-05-04 MED ORDER — METOPROLOL SUCCINATE ER 25 MG PO TB24: 25.0000 mg | ORAL_TABLET | Freq: Every day | ORAL | Status: DC

## 2015-05-04 NOTE — Progress Notes (Signed)
Cardiology Office Note   Date:  05/04/2015   ID:  Teresa Liu, DOB [birth year], MRN [id number]  PCP:  Teresa Casco, MD  Cardiologist:   Thayer Headings, MD   Chief Complaint  Patient presents with  . Shortness of Breath  . Palpitations      History of Present Illness: Teresa Liu is a 40 y.o. female who presents for evaluation of palpitations and shortness of breath .  She has noticed a rapid HR for the past several years.  Went to urgent care.  Was told that she had anxiety Went to her primary md -  Reported 2 days of not sleeping well due to palps Was having constant fast HR  - would last for days Has resolved now.   Not on any new meds.  Eating and drinking normally. No regular exercise  Echo :   Left ventricle: The cavity size was normal. Systolic function was normal. The estimated ejection fraction was in the range of 60% to 65%. Features are consistent with a pseudonormal left ventricular filling pattern, with concomitant abnormal relaxation and increased filling pressure (grade 2 diastolic dysfunction).   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 Has occasional episodes when she cant get enough air in. No recent weight gain recently .  Drinks some caffiene - 3 sodas a week.  No coffee or tea Does not restrict her diet,  Cooks at home .   Records from [place] were reviewed.    Past Medical History  Diagnosis Date  . Migraines   . Meningioma [facility])     Past Surgical History  Procedure Laterality Date  . Craniotomy for tumor  08/26/2010    right frontal craniotomy, gross total resection of brain tumor     Current Outpatient Prescriptions  Medication Sig Dispense Refill  . NUVARING 0.12-0.015 MG/24HR vaginal ring Place 1 each vaginally every 28 (twenty-eight) days.   4   No current facility-administered medications for this visit.    Allergies:   Review of patient's allergies indicates no known allergies.    Social History:  The  patient  reports that she has never smoked. She has never used smokeless tobacco. She reports that she does not drink alcohol or use illicit drugs.   Family History:  The patient's family history includes Cancer in her mother; Colon cancer in her paternal grandmother; Glaucoma in her maternal grandmother; Hypertension in her father.    ROS:  Please see the history of present illness.    Review of Systems: Constitutional:  denies fever, chills, diaphoresis, appetite change and fatigue.  HEENT: denies photophobia, eye pain, redness, hearing loss, ear pain, congestion, sore throat, rhinorrhea, sneezing, neck pain, neck stiffness and tinnitus.  Respiratory: denies SOB, DOE, cough, chest tightness, and wheezing.  Cardiovascular: denies chest pain, palpitations and leg swelling.  Gastrointestinal: denies nausea, vomiting, abdominal pain, diarrhea, constipation, blood in stool.  Genitourinary: denies dysuria, urgency, frequency, hematuria, flank pain and difficulty urinating.  Musculoskeletal: denies  myalgias, back pain, joint swelling, arthralgias and gait problem.   Skin: denies pallor, rash and wound.  Neurological: denies dizziness, seizures, syncope, weakness, light-headedness, numbness and headaches.   Hematological: denies adenopathy, easy bruising, personal or family bleeding history.  Psychiatric/ Behavioral: denies suicidal ideation, mood changes, confusion, nervousness, sleep disturbance and agitation.       All other systems are reviewed and negative.    PHYSICAL EXAM: VS:  BP 120/94 mmHg  Pulse 94  Ht 5\' 4"  (1.626 m)  Wt  186 lb (84.369 kg)  BMI 31.91 kg/m2 , BMI Body mass index is 31.91 kg/(m^2). GEN: Well nourished, well developed, in no acute distress HEENT: normal Neck: no JVD, carotid bruits, or masses Cardiac: RRR; no murmurs, rubs, or gallops,no edema  Respiratory:  clear to auscultation bilaterally, normal work of breathing GI: soft, nontender, nondistended, +  BS MS: no deformity or atrophy Skin: warm and dry, no rash Neuro:  Strength and sensation are intact Psych: normal   EKG:  EKG is ordered today. The ekg ordered today demonstrates  NSR at 94.   Normal ECG    Recent Labs: 05/25/2014: BUN 9.5; Creatinine 0.7    Lipid Panel No results found for: CHOL, TRIG, HDL, CHOLHDL, VLDL, LDLCALC, LDLDIRECT    Wt Readings from Last 3 Encounters:  05/04/15 186 lb (84.369 kg)  07/31/14 185 lb 1.6 oz (83.961 kg)  06/02/14 185 lb 14.4 oz (84.324 kg)      Other studies Reviewed: Additional studies/ records that were reviewed today include: . Review of the above records demonstrates:    ASSESSMENT AND PLAN:  1.  Sinus tachycardia: She presents today with sinus tachycardia and episodes of palpitations. Her echocardiogram reveals grade 2 diastolic dysfunction. She has normal left ventricular  systolic function.  We will start her on Toprol-XL 25 mg a day. We will encourage her to exercise.   Current medicines are reviewed at length with the patient today.  The patient does not have concerns regarding medicines.  The following changes have been made:    Add Toprol 25 mg a day   Labs/ tests ordered today include:  No orders of the defined types were placed in this encounter.     Disposition:   FU with me in 3 months .      [name], Wonda Cheng, MD  05/04/2015 8:42 AM    [facility] [place], [place], [place]  [address] Phone: [phone]; Fax: [phone]   [facility]  [address] [place], [place]  [address] [phone]   Fax [phone]

## 2015-05-04 NOTE — Patient Instructions (Signed)
Medication Instructions:  START Toprol XL 25 mg once daily   Labwork: None Ordered   Testing/Procedures: None Ordered   Follow-Up: Your physician recommends that you exercise 3-4 times per week  Your physician recommends that you schedule a follow-up appointment in: 3 months with Dr. Acie Fredrickson   If you need a refill on your cardiac medications before your next appointment, please call your pharmacy.   Thank you for choosing [facility]! Christen Bame, RN [phone]

## 2015-08-20 ENCOUNTER — Ambulatory Visit (INDEPENDENT_AMBULATORY_CARE_PROVIDER_SITE_OTHER): Payer: 59 | Admitting: Cardiovascular Disease

## 2015-08-20 ENCOUNTER — Encounter: Payer: Self-pay | Admitting: Cardiovascular Disease

## 2015-08-20 VITALS — BP 132/90 | HR 80 | Ht 64.0 in | Wt 191.1 lb

## 2015-08-20 DIAGNOSIS — I10 Essential (primary) hypertension: Secondary | ICD-10-CM

## 2015-08-20 DIAGNOSIS — R002 Palpitations: Secondary | ICD-10-CM

## 2015-08-20 DIAGNOSIS — R000 Tachycardia, unspecified: Secondary | ICD-10-CM | POA: Diagnosis not present

## 2015-08-20 HISTORY — DX: Essential (primary) hypertension: I10

## 2015-08-20 MED ORDER — METOPROLOL SUCCINATE ER 50 MG PO TB24: 50.0000 mg | ORAL_TABLET | Freq: Every day | ORAL | Status: DC

## 2015-08-20 NOTE — Progress Notes (Signed)
Cardiology Office Note   Date:  08/20/2015   ID:  Teresa Liu, DOB 05-Sep-1975, MRN [id number]  PCP:  Osborne Casco, MD  Cardiologist:   Mertie Moores, MD   Chief Complaint  Patient presents with  . Follow-up      History of Present Illness: Teresa Liu is a 39 y.o. female who presents for evaluation of palpitations and shortness of breath .  She has noticed a rapid HR for the past several years.  Went to urgent care.  Was told that she had anxiety Went to her primary md -  Reported 2 days of not sleeping well due to palps Was having constant fast HR  - would last for days Has resolved now.   Not on any new meds.  Eating and drinking normally. No regular exercise  Echo :   Left ventricle: The cavity size was normal. Systolic function was normal. The estimated ejection fraction was in the range of 60% to 65%. Features are consistent with a pseudonormal left ventricular filling pattern, with concomitant abnormal relaxation and increased filling pressure (grade 2 diastolic dysfunction).   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 Has occasional episodes when she cant get enough air in. No recent weight gain recently .  Drinks some caffiene - 3 sodas a week.  No coffee or tea Does not restrict her diet,  Cooks at home .   Records from [place] were reviewed.   August 20, 2015:  Tolerating The metoprolol fairly well. She's not having as many palpitations. Her blood pressure was okay during her last visit. It is a little elevated here today.   Past Medical History  Diagnosis Date  . Migraines   . Meningioma [facility])     Past Surgical History  Procedure Laterality Date  . Craniotomy for tumor  08/26/2010    right frontal craniotomy, gross total resection of brain tumor     Current Outpatient Prescriptions  Medication Sig Dispense Refill  . flurbiprofen (ANSAID) 100 MG tablet Take 100 mg by mouth 2 (two) times daily.  0  . HYDROcodone-acetaminophen  (NORCO/VICODIN) 5-325 MG tablet Take 1 tablet by mouth as needed. AS NEEDED FOR PAIN  0  . imipramine (TOFRANIL) 10 MG tablet Take 10 mg by mouth at bedtime.    . metoprolol succinate (TOPROL-XL) 25 MG 24 hr tablet Take 1 tablet (25 mg total) by mouth daily. 30 tablet 11  . naratriptan (AMERGE) 2.5 MG tablet Take 2.5 mg by mouth as needed. AS NEEDED FOR MIGRAINE  1  . NUVARING 0.12-0.015 MG/24HR vaginal ring Place 1 each vaginally every 28 (twenty-eight) days.   4   No current facility-administered medications for this visit.    Allergies:   Review of patient's allergies indicates no known allergies.    Social History:  The patient  reports that she has never smoked. She has never used smokeless tobacco. She reports that she does not drink alcohol or use illicit drugs.   Family History:  The patient's family history includes Cancer in her mother; Colon cancer in her paternal grandmother; Glaucoma in her maternal grandmother; Hypertension in her father.    ROS:  Please see the history of present illness.    Review of Systems: Constitutional:  denies fever, chills, diaphoresis, appetite change and fatigue.  HEENT: denies photophobia, eye pain, redness, hearing loss, ear pain, congestion, sore throat, rhinorrhea, sneezing, neck pain, neck stiffness and tinnitus.  Respiratory: denies SOB, DOE, cough, chest tightness, and wheezing.  Cardiovascular: denies  chest pain, palpitations and leg swelling.  Gastrointestinal: denies nausea, vomiting, abdominal pain, diarrhea, constipation, blood in stool.  Genitourinary: denies dysuria, urgency, frequency, hematuria, flank pain and difficulty urinating.  Musculoskeletal: denies  myalgias, back pain, joint swelling, arthralgias and gait problem.   Skin: denies pallor, rash and wound.  Neurological: denies dizziness, seizures, syncope, weakness, light-headedness, numbness and headaches.   Hematological: denies adenopathy, easy bruising, personal or  family bleeding history.  Psychiatric/ Behavioral: denies suicidal ideation, mood changes, confusion, nervousness, sleep disturbance and agitation.       All other systems are reviewed and negative.    PHYSICAL EXAM: VS:  BP 126/104 mmHg  Pulse 80  Ht 5\' 4"  (1.626 m)  Wt 191 lb 1.9 oz (86.691 kg)  BMI 32.79 kg/m2 , BMI Body mass index is 32.79 kg/(m^2). GEN: Well nourished, well developed, in no acute distress HEENT: normal Neck: no JVD, carotid bruits, or masses Cardiac: RRR; no murmurs, rubs, or gallops,no edema  Respiratory:  clear to auscultation bilaterally, normal work of breathing GI: soft, nontender, nondistended, + BS MS: no deformity or atrophy Skin: warm and dry, no rash Neuro:  Strength and sensation are intact Psych: normal   EKG:  EKG is not ordered today.    Recent Labs: No results found for requested labs within last 365 days.    Lipid Panel No results found for: CHOL, TRIG, HDL, CHOLHDL, VLDL, LDLCALC, LDLDIRECT    Wt Readings from Last 3 Encounters:  08/20/15 191 lb 1.9 oz (86.691 kg)  05/04/15 186 lb (84.369 kg)  07/31/14 185 lb 1.6 oz (83.961 kg)      Other studies Reviewed: Additional studies/ records that were reviewed today include: . Review of the above records demonstrates:    ASSESSMENT AND PLAN:  1.  Sinus tachycardia: She presents today with sinus tachycardia and episodes of palpitations. Her echocardiogram reveals grade 2 diastolic dysfunction. She has normal left ventricular  systolic function.  HR is still elevated.  Eats a very salty diet. Discussed reducing her salt .   2. Essential hypertension: She eats a very high salt diet. We discussed lots of ways to reduce the salt in her diet. We'll increase the Toprol-XL to 50 mg a day. We may need to add a diuretic in the future.   Current medicines are reviewed at length with the patient today.  The patient does not have concerns regarding medicines.  The following changes  have been made:    Increase Toprol to 50 mg today.  Labs/ tests ordered today include:  No orders of the defined types were placed in this encounter.     Disposition:   FU with me in 6  months .      Mertie Moores, MD  08/20/2015 4:56 PM    [place], [place], [place]  [address] Phone: [phone]; Fax: [phone]   [facility]  [address] [place], [place]  [address] [phone]   Fax [phone]

## 2015-08-20 NOTE — Patient Instructions (Signed)
Medication Instructions:  INCREASE Toprol (Metoprolol) to 50 mg once daily   Labwork: None Ordered   Testing/Procedures: None Ordered   Follow-Up: Your physician wants you to follow-up in: 6 months with Dr. Acie Fredrickson.  You will receive a reminder letter in the mail two months in advance. If you don't receive a letter, please call our office to schedule the follow-up appointment.   If you need a refill on your cardiac medications before your next appointment, please call your pharmacy.   Thank you for choosing [facility]! Christen Bame, RN [phone]

## 2015-12-24 ENCOUNTER — Other Ambulatory Visit: Payer: Self-pay | Admitting: *Deleted

## 2015-12-24 MED ORDER — METOPROLOL SUCCINATE ER 50 MG PO TB24: 50.0000 mg | ORAL_TABLET | Freq: Every day | ORAL | 1 refills | Status: DC

## 2016-03-03 ENCOUNTER — Encounter: Payer: Self-pay | Admitting: Cardiovascular Disease

## 2016-03-03 ENCOUNTER — Ambulatory Visit (INDEPENDENT_AMBULATORY_CARE_PROVIDER_SITE_OTHER): Payer: 59 | Admitting: Cardiovascular Disease

## 2016-03-03 VITALS — BP 136/90 | HR 90 | Ht 64.0 in | Wt 190.8 lb

## 2016-03-03 DIAGNOSIS — I10 Essential (primary) hypertension: Secondary | ICD-10-CM

## 2016-03-03 MED ORDER — METOPROLOL SUCCINATE ER 100 MG PO TB24: 100.0000 mg | ORAL_TABLET | Freq: Every day | ORAL | 11 refills | Status: DC

## 2016-03-03 NOTE — Patient Instructions (Signed)
Medication Instructions:  INCREASE Toprol to 100 mg once daily   Labwork: None Ordered   Testing/Procedures: None Ordered   Follow-Up: Your physician wants you to follow-up in: 6 months with Dr. Acie Fredrickson.  You will receive a reminder letter in the mail two months in advance. If you don't receive a letter, please call our office to schedule the follow-up appointment.   If you need a refill on your cardiac medications before your next appointment, please call your pharmacy.   Thank you for choosing [facility]! Christen Bame, RN [phone]

## 2016-03-03 NOTE — Progress Notes (Signed)
Cardiology Office Note   Date:  03/03/2016   ID:  Teresa Liu, DOB 1975/08/26, MRN [id number]  PCP:  Osborne Casco, MD  Cardiologist:   Mertie Moores, MD   Chief Complaint  Patient presents with  . Hypertension      History of Present Illness: Teresa Liu is a 41 y.o. female who presents for evaluation of palpitations and shortness of breath .  She has noticed a rapid HR for the past several years.  Went to urgent care.  Was told that she had anxiety Went to her primary md -  Reported 2 days of not sleeping well due to palps Was having constant fast HR  - would last for days Has resolved now.   Not on any new meds.  Eating and drinking normally. No regular exercise  Echo :   Left ventricle: The cavity size was normal. Systolic function was normal. The estimated ejection fraction was in the range of 60% to 65%. Features are consistent with a pseudonormal left ventricular filling pattern, with concomitant abnormal relaxation and increased filling pressure (grade 2 diastolic dysfunction).   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 Has occasional episodes when she cant get enough air in. No recent weight gain recently .  Drinks some caffiene - 3 sodas a week.  No coffee or tea Does not restrict her diet,  Cooks at home .   Records from [place] were reviewed.   August 20, 2015:  Tolerating The metoprolol fairly well. She's not having as many palpitations. Her blood pressure was okay during her last visit. It is a little elevated here today.  Jan. 8, 2018:  Doing well ,   Is working on her diet.  Not getting as much exercise as she should    Past Medical History:  Diagnosis Date  . HTN (hypertension) 08/20/2015  . Meningioma ([place])   . Migraines     Past Surgical History:  Procedure Laterality Date  . CRANIOTOMY FOR TUMOR  08/26/2010   right frontal craniotomy, gross total resection of brain tumor     Current Outpatient Prescriptions  Medication  Sig Dispense Refill  . flurbiprofen (ANSAID) 100 MG tablet Take 100 mg by mouth 2 (two) times daily.  0  . HYDROcodone-acetaminophen (NORCO/VICODIN) 5-325 MG tablet Take 1 tablet by mouth as needed. AS NEEDED FOR PAIN  0  . imipramine (TOFRANIL) 10 MG tablet Take 10 mg by mouth at bedtime.    . metoprolol succinate (TOPROL-XL) 50 MG 24 hr tablet Take 1 tablet (50 mg total) by mouth daily. 90 tablet 1  . naratriptan (AMERGE) 2.5 MG tablet Take 2.5 mg by mouth as needed. AS NEEDED FOR MIGRAINE  1  . NUVARING 0.12-0.015 MG/24HR vaginal ring Place 1 each vaginally every 28 (twenty-eight) days.   4   No current facility-administered medications for this visit.     Allergies:   Patient has no known allergies.    Social History:  The patient  reports that she has never smoked. She has never used smokeless tobacco. She reports that she does not drink alcohol or use drugs.   Family History:  The patient's family history includes Cancer in her mother; Colon cancer in her paternal grandmother; Glaucoma in her maternal grandmother; Hypertension in her father.    ROS:  Please see the history of present illness.    Review of Systems: Constitutional:  denies fever, chills, diaphoresis, appetite change and fatigue.  HEENT: denies photophobia, eye pain, redness, hearing loss,  ear pain, congestion, sore throat, rhinorrhea, sneezing, neck pain, neck stiffness and tinnitus.  Respiratory: denies SOB, DOE, cough, chest tightness, and wheezing.  Cardiovascular: denies chest pain, palpitations and leg swelling.  Gastrointestinal: denies nausea, vomiting, abdominal pain, diarrhea, constipation, blood in stool.  Genitourinary: denies dysuria, urgency, frequency, hematuria, flank pain and difficulty urinating.  Musculoskeletal: denies  myalgias, back pain, joint swelling, arthralgias and gait problem.   Skin: denies pallor, rash and wound.  Neurological: denies dizziness, seizures, syncope, weakness,  light-headedness, numbness and headaches.   Hematological: denies adenopathy, easy bruising, personal or family bleeding history.  Psychiatric/ Behavioral: denies suicidal ideation, mood changes, confusion, nervousness, sleep disturbance and agitation.       All other systems are reviewed and negative.    PHYSICAL EXAM: VS:  BP 136/90 (BP Location: Left Arm, Patient Position: Sitting, Cuff Size: Large)   Pulse 90   Ht 5\' 4"  (1.626 m)   Wt 190 lb 12.8 oz (86.5 kg)   SpO2 99%   BMI 32.75 kg/m  , BMI Body mass index is 32.75 kg/m. GEN: Well nourished, well developed, in no acute distress  HEENT: normal  Neck: no JVD, carotid bruits, or masses Cardiac: RRR; no murmurs, rubs, or gallops,no edema  Respiratory:  clear to auscultation bilaterally, normal work of breathing GI: soft, nontender, nondistended, + BS MS: no deformity or atrophy  Skin: warm and dry, no rash Neuro:  Strength and sensation are intact Psych: normal   EKG:  EKG is not ordered today.    Recent Labs: No results found for requested labs within last 8760 hours.    Lipid Panel No results found for: CHOL, TRIG, HDL, CHOLHDL, VLDL, LDLCALC, LDLDIRECT    Wt Readings from Last 3 Encounters:  03/03/16 190 lb 12.8 oz (86.5 kg)  08/20/15 191 lb 1.9 oz (86.7 kg)  05/04/15 186 lb (84.4 kg)      Other studies Reviewed: Additional studies/ records that were reviewed today include: . Review of the above records demonstrates:    ASSESSMENT AND PLAN:  1.  Sinus tachycardia:  Better . Her heart rate still a little fast. We will increase the Toprol-XL to 100 mg a day.  2. Essential hypertension: She eats a very high salt diet.  Still eats some fried foods.  Advised her to continue to watch her salt. She is exercise on a regular basis.  I'll see her again in 6 months.   Labs/ tests ordered today include:  No orders of the defined types were placed in this encounter.   Disposition:   FU with me in 6   months .      Mertie Moores, MD  03/03/2016 3:43 PM    [place], [place], [place]  [address] Phone: [phone]; Fax: [phone]

## 2016-07-31 ENCOUNTER — Ambulatory Visit (INDEPENDENT_AMBULATORY_CARE_PROVIDER_SITE_OTHER): Payer: 59 | Admitting: Obstetrics & Gynecology

## 2016-07-31 ENCOUNTER — Encounter: Payer: Self-pay | Admitting: Obstetrics & Gynecology

## 2016-07-31 VITALS — BP 140/92 | Ht 63.5 in | Wt 195.0 lb

## 2016-07-31 DIAGNOSIS — Z1151 Encounter for screening for human papillomavirus (HPV): Secondary | ICD-10-CM | POA: Diagnosis not present

## 2016-07-31 DIAGNOSIS — Z3044 Encounter for surveillance of vaginal ring hormonal contraceptive device: Secondary | ICD-10-CM | POA: Diagnosis not present

## 2016-07-31 DIAGNOSIS — I10 Essential (primary) hypertension: Secondary | ICD-10-CM

## 2016-07-31 DIAGNOSIS — Z01419 Encounter for gynecological examination (general) (routine) without abnormal findings: Secondary | ICD-10-CM | POA: Diagnosis not present

## 2016-07-31 NOTE — Progress Notes (Signed)
Teresa Liu 1975/08/22 [id number]   History:    41 y.o. G2P2 married.  Daughter 33 yo, son 70 yo.  RP:  Established patient presenting  for annual gyn exam   HPI:  Well on Nuvaring.  No abnormal bleeding.  No pelvic pain.  Normal vaginal secretions.  Breasts wnl.  Mictions/BMs normal.  H/O Brain hemangioma treated, stable currently.  Past medical history,surgical history, family history and social history were all reviewed and documented in the EPIC chart.  Gynecologic History Patient's last menstrual period was 07/08/2016. Contraception: NuvaRing vaginal inserts Last Pap: 2016. Results were: normal/HPV HR neg Last mammogram: Never.   Obstetric History OB History  Gravida Para Term Preterm AB Living  2 2       2   SAB TAB Ectopic Multiple Live Births               # Outcome Date GA Lbr Len/2nd Weight Sex Delivery Anes PTL Lv  2 Para           1 Para                ROS: A ROS was performed and pertinent positives and negatives are included in the history.  GENERAL: No fevers or chills. HEENT: No change in vision, no earache, sore throat or sinus congestion. NECK: No pain or stiffness. CARDIOVASCULAR: No chest pain or pressure. No palpitations. PULMONARY: No shortness of breath, cough or wheeze. GASTROINTESTINAL: No abdominal pain, nausea, vomiting or diarrhea, melena or bright red blood per rectum. GENITOURINARY: No urinary frequency, urgency, hesitancy or dysuria. MUSCULOSKELETAL: No joint or muscle pain, no back pain, no recent trauma. DERMATOLOGIC: No rash, no itching, no lesions. ENDOCRINE: No polyuria, polydipsia, no heat or cold intolerance. No recent change in weight. HEMATOLOGICAL: No anemia or easy bruising or bleeding. NEUROLOGIC: No headache, seizures, numbness, tingling or weakness. PSYCHIATRIC: No depression, no loss of interest in normal activity or change in sleep pattern.     Exam:   BP (!) 140/92   Ht 5' 3.5" (1.613 m)   Wt 195 lb (88.5 kg)   LMP  07/08/2016   BMI 34.00 kg/m   Body mass index is 34 kg/m.  General appearance : Well developed well nourished female. No acute distress HEENT: Eyes: no retinal hemorrhage or exudates,  Neck supple, trachea midline, no carotid bruits, no thyroidmegaly Lungs: Clear to auscultation, no rhonchi or wheezes, or rib retractions  Heart: Regular rate and rhythm, no murmurs or gallops Breast:Examined in sitting and supine position were symmetrical in appearance, no palpable masses or tenderness,  no skin retraction, no nipple inversion, no nipple discharge, no skin discoloration, no axillary or supraclavicular lymphadenopathy Abdomen: no palpable masses or tenderness, no rebound or guarding Extremities: no edema or skin discoloration or tenderness  Pelvic:  Bartholin, Urethra, Skene Glands: Within normal limits             Vagina: No gross lesions or discharge  Cervix: No gross lesions or discharge.  Pap/HPV done  Uterus  AV, normal size, shape and consistency, non-tender and mobile  Adnexa  Without masses or tenderness  Anus and perineum  normal     Assessment/Plan:  41 y.o. female for annual exam  1. Encounter for routine gynecological examination with Papanicolaou smear of cervix Normal Gyn exam.  Pap/HPV done.  Breasts wnl.  2. Encounter for surveillance of vaginal ring hormonal contraceptive device Nuvaring represcribed.  BP increased today.  If cHTN Dx, will call back to  discuss alternative contraception without Estrogens.  3. Hypertension, unspecified type BP elevated today.  Recommended low salt diet and visit with Fam MD to evaluate.  Counseling >50% x 10 minutes on above issues.  Princess Bruins MD, 3:15 PM 07/31/2016

## 2016-07-31 NOTE — Patient Instructions (Signed)
1. Encounter for routine gynecological examination with Papanicolaou smear of cervix Normal Gyn exam.  Pap/HPV done.  Breasts wnl.  2. Encounter for surveillance of vaginal ring hormonal contraceptive device Nuvaring represcribed.  BP increased today.  If cHTN Dx, will call back to discuss alternative contraception without Estrogens.  3. Hypertension, unspecified type BP elevated today.  Recommended low salt diet and visit with Fam MD to evaluate.   Teresa Liu, it was a pleasure to see you today!  I will inform you of your result as soon as available.  You will find below my recommendations for a healthy lifestyle.  Health Maintenance, Female Adopting a healthy lifestyle and getting preventive care can go a long way to promote health and wellness. Talk with your health care provider about what schedule of regular examinations is right for you. This is a good chance for you to check in with your provider about disease prevention and staying healthy. In between checkups, there are plenty of things you can do on your own. Experts have done a lot of research about which lifestyle changes and preventive measures are most likely to keep you healthy. Ask your health care provider for more information. Weight and diet Eat a healthy diet  Be sure to include plenty of vegetables, fruits, low-fat dairy products, and lean protein.  Do not eat a lot of foods high in solid fats, added sugars, or salt.  Get regular exercise. This is one of the most important things you can do for your health. ? Most adults should exercise for at least 150 minutes each week. The exercise should increase your heart rate and make you sweat (moderate-intensity exercise). ? Most adults should also do strengthening exercises at least twice a week. This is in addition to the moderate-intensity exercise.  Maintain a healthy weight  Body mass index (BMI) is a measurement that can be used to identify possible weight problems. It  estimates body fat based on height and weight. Your health care provider can help determine your BMI and help you achieve or maintain a healthy weight.  For females 94 years of age and older: ? A BMI below 18.5 is considered underweight. ? A BMI of 18.5 to 24.9 is normal. ? A BMI of 25 to 29.9 is considered overweight. ? A BMI of 30 and above is considered obese.  Watch levels of cholesterol and blood lipids  You should start having your blood tested for lipids and cholesterol at 39 years of age, then have this test every 5 years.  You may need to have your cholesterol levels checked more often if: ? Your lipid or cholesterol levels are high. ? You are older than 39 years of age. ? You are at high risk for heart disease.  Cancer screening Lung Cancer  Lung cancer screening is recommended for adults 57-27 years old who are at high risk for lung cancer because of a history of smoking.  A yearly low-dose CT scan of the lungs is recommended for people who: ? Currently smoke. ? Have quit within the past 15 years. ? Have at least a 30-pack-year history of smoking. A pack year is smoking an average of one pack of cigarettes a day for 1 year.  Yearly screening should continue until it has been 15 years since you quit.  Yearly screening should stop if you develop a health problem that would prevent you from having lung cancer treatment.  Breast Cancer  Practice breast self-awareness. This means understanding how your  breasts normally appear and feel.  It also means doing regular breast self-exams. Let your health care provider know about any changes, no matter how small.  If you are in your 20s or 30s, you should have a clinical breast exam (CBE) by a health care provider every 1-3 years as part of a regular health exam.  If you are 25 or older, have a CBE every year. Also consider having a breast X-ray (mammogram) every year.  If you have a family history of breast cancer, talk to  your health care provider about genetic screening.  If you are at high risk for breast cancer, talk to your health care provider about having an MRI and a mammogram every year.  Breast cancer gene (BRCA) assessment is recommended for women who have family members with BRCA-related cancers. BRCA-related cancers include: ? Breast. ? Ovarian. ? Tubal. ? Peritoneal cancers.  Results of the assessment will determine the need for genetic counseling and BRCA1 and BRCA2 testing.  Cervical Cancer Your health care provider may recommend that you be screened regularly for cancer of the pelvic organs (ovaries, uterus, and vagina). This screening involves a pelvic examination, including checking for microscopic changes to the surface of your cervix (Pap test). You may be encouraged to have this screening done every 3 years, beginning at age 45.  For women ages 56-65, health care providers may recommend pelvic exams and Pap testing every 3 years, or they may recommend the Pap and pelvic exam, combined with testing for human papilloma virus (HPV), every 5 years. Some types of HPV increase your risk of cervical cancer. Testing for HPV may also be done on women of any age with unclear Pap test results.  Other health care providers may not recommend any screening for nonpregnant women who are considered low risk for pelvic cancer and who do not have symptoms. Ask your health care provider if a screening pelvic exam is right for you.  If you have had past treatment for cervical cancer or a condition that could lead to cancer, you need Pap tests and screening for cancer for at least 20 years after your treatment. If Pap tests have been discontinued, your risk factors (such as having a new sexual partner) need to be reassessed to determine if screening should resume. Some women have medical problems that increase the chance of getting cervical cancer. In these cases, your health care provider may recommend more  frequent screening and Pap tests.  Colorectal Cancer  This type of cancer can be detected and often prevented.  Routine colorectal cancer screening usually begins at 39 years of age and continues through 39 years of age.  Your health care provider may recommend screening at an earlier age if you have risk factors for colon cancer.  Your health care provider may also recommend using home test kits to check for hidden blood in the stool.  A small camera at the end of a tube can be used to examine your colon directly (sigmoidoscopy or colonoscopy). This is done to check for the earliest forms of colorectal cancer.  Routine screening usually begins at age 82.  Direct examination of the colon should be repeated every 5-10 years through 39 years of age. However, you may need to be screened more often if early forms of precancerous polyps or small growths are found.  Skin Cancer  Check your skin from head to toe regularly.  Tell your health care provider about any new moles or changes in  moles, especially if there is a change in a mole's shape or color.  Also tell your health care provider if you have a mole that is larger than the size of a pencil eraser.  Always use sunscreen. Apply sunscreen liberally and repeatedly throughout the day.  Protect yourself by wearing long sleeves, pants, a wide-brimmed hat, and sunglasses whenever you are outside.  Heart disease, diabetes, and high blood pressure  High blood pressure causes heart disease and increases the risk of stroke. High blood pressure is more likely to develop in: ? People who have blood pressure in the high end of the normal range (130-139/85-89 mm Hg). ? People who are overweight or obese. ? People who are African American.  If you are 52-35 years of age, have your blood pressure checked every 3-5 years. If you are 64 years of age or older, have your blood pressure checked every year. You should have your blood pressure measured  twice-once when you are at a hospital or clinic, and once when you are not at a hospital or clinic. Record the average of the two measurements. To check your blood pressure when you are not at a hospital or clinic, you can use: ? An automated blood pressure machine at a pharmacy. ? A home blood pressure monitor.  If you are between 38 years and 97 years old, ask your health care provider if you should take aspirin to prevent strokes.  Have regular diabetes screenings. This involves taking a blood sample to check your fasting blood sugar level. ? If you are at a normal weight and have a low risk for diabetes, have this test once every three years after 39 years of age. ? If you are overweight and have a high risk for diabetes, consider being tested at a younger age or more often. Preventing infection Hepatitis B  If you have a higher risk for hepatitis B, you should be screened for this virus. You are considered at high risk for hepatitis B if: ? You were born in a country where hepatitis B is common. Ask your health care provider which countries are considered high risk. ? Your parents were born in a high-risk country, and you have not been immunized against hepatitis B (hepatitis B vaccine). ? You have HIV or AIDS. ? You use needles to inject street drugs. ? You live with someone who has hepatitis B. ? You have had sex with someone who has hepatitis B. ? You get hemodialysis treatment. ? You take certain medicines for conditions, including cancer, organ transplantation, and autoimmune conditions.  Hepatitis C  Blood testing is recommended for: ? Everyone born from [birth year] through 1963. ? Anyone with known risk factors for hepatitis C.  Sexually transmitted infections (STIs)  You should be screened for sexually transmitted infections (STIs) including gonorrhea and chlamydia if: ? You are sexually active and are younger than 39 years of age. ? You are older than 39 years of age and your  health care provider tells you that you are at risk for this type of infection. ? Your sexual activity has changed since you were last screened and you are at an increased risk for chlamydia or gonorrhea. Ask your health care provider if you are at risk.  If you do not have HIV, but are at risk, it may be recommended that you take a prescription medicine daily to prevent HIV infection. This is called pre-exposure prophylaxis (PrEP). You are considered at risk if: ? You are  sexually active and do not regularly use condoms or know the HIV status of your partner(s). ? You take drugs by injection. ? You are sexually active with a partner who has HIV.  Talk with your health care provider about whether you are at high risk of being infected with HIV. If you choose to begin PrEP, you should first be tested for HIV. You should then be tested every 3 months for as long as you are taking PrEP. Pregnancy  If you are premenopausal and you may become pregnant, ask your health care provider about preconception counseling.  If you may become pregnant, take 400 to 800 micrograms (mcg) of folic acid every day.  If you want to prevent pregnancy, talk to your health care provider about birth control (contraception). Osteoporosis and menopause  Osteoporosis is a disease in which the bones lose minerals and strength with aging. This can result in serious bone fractures. Your risk for osteoporosis can be identified using a bone density scan.  If you are 7 years of age or older, or if you are at risk for osteoporosis and fractures, ask your health care provider if you should be screened.  Ask your health care provider whether you should take a calcium or vitamin D supplement to lower your risk for osteoporosis.  Menopause may have certain physical symptoms and risks.  Hormone replacement therapy may reduce some of these symptoms and risks. Talk to your health care provider about whether hormone replacement  therapy is right for you. Follow these instructions at home:  Schedule regular health, dental, and eye exams.  Stay current with your immunizations.  Do not use any tobacco products including cigarettes, chewing tobacco, or electronic cigarettes.  If you are pregnant, do not drink alcohol.  If you are breastfeeding, limit how much and how often you drink alcohol.  Limit alcohol intake to no more than 1 drink per day for nonpregnant women. One drink equals 12 ounces of beer, 5 ounces of wine, or 1 ounces of hard liquor.  Do not use street drugs.  Do not share needles.  Ask your health care provider for help if you need support or information about quitting drugs.  Tell your health care provider if you often feel depressed.  Tell your health care provider if you have ever been abused or do not feel safe at home. This information is not intended to replace advice given to you by your health care provider. Make sure you discuss any questions you have with your health care provider. Document Released: 08/26/2010 Document Revised: 07/19/2015 Document Reviewed: 11/14/2014 Elsevier Interactive Patient Education  [employer].

## 2016-07-31 NOTE — Addendum Note (Signed)
Addended by: Thurnell Garbe on: 07/31/2016 03:48 PM   Modules accepted: Orders

## 2016-08-04 LAB — PAP, TP IMAGING W/ HPV RNA, RFLX HPV TYPE 16,18/45: HPV mRNA, High Risk: NOT DETECTED

## 2016-09-04 ENCOUNTER — Other Ambulatory Visit: Payer: Self-pay | Admitting: Anesthesiology

## 2016-09-04 MED ORDER — ETONOGESTREL-ETHINYL ESTRADIOL 0.12-0.015 MG/24HR VA RING: 1.0000 | VAGINAL_RING | VAGINAL | 4 refills | Status: DC

## 2016-09-05 ENCOUNTER — Other Ambulatory Visit: Payer: Self-pay | Admitting: *Deleted

## 2016-09-05 MED ORDER — ETONOGESTREL-ETHINYL ESTRADIOL 0.12-0.015 MG/24HR VA RING: 1.0000 | VAGINAL_RING | VAGINAL | 4 refills | Status: DC

## 2016-09-13 ENCOUNTER — Other Ambulatory Visit: Payer: Self-pay | Admitting: Cardiovascular Disease

## 2016-09-22 ENCOUNTER — Other Ambulatory Visit: Payer: Self-pay | Admitting: Radiation Therapy

## 2016-09-22 DIAGNOSIS — D320 Benign neoplasm of cerebral meninges: Secondary | ICD-10-CM

## 2016-10-06 ENCOUNTER — Telehealth: Payer: Self-pay | Admitting: Radiation Therapy

## 2016-10-06 NOTE — Telephone Encounter (Signed)
Kimley called to cancel her August MRI and follow-up with Ashlyn. She recently had an MRI ordered by Dr. Ellene Route and was seen in early August at his office. Dr. Ellene Route has recommended follow-up in 12 mo.rather than 6 mo.  Per her request, I have cancelled her follow-up with Ashlyn. The plan is to re-scan and follow-up in Aug 2019.  Mont Dutton R.T.(R)(T) Special Procedures Navigator  Radiation Oncology

## 2016-10-07 ENCOUNTER — Inpatient Hospital Stay: Admission: RE | Admit: 2016-10-07 | Payer: 59 | Source: Ambulatory Visit

## 2016-10-14 ENCOUNTER — Ambulatory Visit: Payer: 59 | Admitting: Urology

## 2017-03-10 ENCOUNTER — Ambulatory Visit (INDEPENDENT_AMBULATORY_CARE_PROVIDER_SITE_OTHER): Payer: PRIVATE HEALTH INSURANCE | Admitting: Licensed Clinical Social Worker

## 2017-03-10 DIAGNOSIS — F321 Major depressive disorder, single episode, moderate: Secondary | ICD-10-CM

## 2017-03-19 ENCOUNTER — Ambulatory Visit (INDEPENDENT_AMBULATORY_CARE_PROVIDER_SITE_OTHER): Payer: PRIVATE HEALTH INSURANCE | Admitting: Licensed Clinical Social Worker

## 2017-03-19 DIAGNOSIS — F321 Major depressive disorder, single episode, moderate: Secondary | ICD-10-CM

## 2017-04-01 ENCOUNTER — Ambulatory Visit (INDEPENDENT_AMBULATORY_CARE_PROVIDER_SITE_OTHER): Payer: PRIVATE HEALTH INSURANCE | Admitting: Licensed Clinical Social Worker

## 2017-04-01 DIAGNOSIS — F324 Major depressive disorder, single episode, in partial remission: Secondary | ICD-10-CM

## 2017-04-03 ENCOUNTER — Ambulatory Visit: Payer: 59 | Admitting: Cardiovascular Disease

## 2017-04-16 ENCOUNTER — Ambulatory Visit (INDEPENDENT_AMBULATORY_CARE_PROVIDER_SITE_OTHER): Payer: PRIVATE HEALTH INSURANCE | Admitting: Licensed Clinical Social Worker

## 2017-04-16 DIAGNOSIS — F324 Major depressive disorder, single episode, in partial remission: Secondary | ICD-10-CM | POA: Diagnosis not present

## 2017-04-22 ENCOUNTER — Ambulatory Visit (INDEPENDENT_AMBULATORY_CARE_PROVIDER_SITE_OTHER): Payer: 59 | Admitting: Physician Assistant

## 2017-04-22 ENCOUNTER — Encounter: Payer: Self-pay | Admitting: Physician Assistant

## 2017-04-22 VITALS — BP 130/80 | HR 64 | Ht 63.5 in | Wt 195.8 lb

## 2017-04-22 DIAGNOSIS — I10 Essential (primary) hypertension: Secondary | ICD-10-CM | POA: Diagnosis not present

## 2017-04-22 MED ORDER — METOPROLOL SUCCINATE ER 100 MG PO TB24: 100.0000 mg | ORAL_TABLET | Freq: Every day | ORAL | 3 refills | Status: DC

## 2017-04-22 NOTE — Patient Instructions (Addendum)
Medication Instructions:  No changes.   Labwork: None   Testing/Procedures: None  Follow-Up: Mertie Moores, MD in 1 year.  Any Other Special Instructions Will Be Listed Below (If Applicable). Work on limiting salt, increasing activity (walking for 30 minutes a day for 5-7 days a week) and losing weight. If your blood pressure remains > 130/80, call.  If you need a refill on your cardiac medications before your next appointment, please call your pharmacy.    Low-Sodium Eating Plan Sodium, which is an element that makes up salt, helps you maintain a healthy balance of fluids in your body. Too much sodium can increase your blood pressure and cause fluid and waste to be held in your body. Your health care provider or dietitian may recommend following this plan if you have high blood pressure (hypertension), kidney disease, liver disease, or heart failure. Eating less sodium can help lower your blood pressure, reduce swelling, and protect your heart, liver, and kidneys. What are tips for following this plan? General guidelines  Most people on this plan should limit their sodium intake to 1,500-2,000 mg (milligrams) of sodium each day. Reading food labels  The Nutrition Facts label lists the amount of sodium in one serving of the food. If you eat more than one serving, you must multiply the listed amount of sodium by the number of servings.  Choose foods with less than 140 mg of sodium per serving.  Avoid foods with 300 mg of sodium or more per serving. Shopping  Look for lower-sodium products, often labeled as "low-sodium" or "no salt added."  Always check the sodium content even if foods are labeled as "unsalted" or "no salt added".  Buy fresh foods. ? Avoid canned foods and premade or frozen meals. ? Avoid canned, cured, or processed meats  Buy breads that have less than 80 mg of sodium per slice. Cooking  Eat more home-cooked food and less restaurant, buffet, and fast  food.  Avoid adding salt when cooking. Use salt-free seasonings or herbs instead of table salt or sea salt. Check with your health care provider or pharmacist before using salt substitutes.  Cook with plant-based oils, such as canola, sunflower, or olive oil. Meal planning  When eating at a restaurant, ask that your food be prepared with less salt or no salt, if possible.  Avoid foods that contain MSG (monosodium glutamate). MSG is sometimes added to [place] food, bouillon, and some canned foods. What foods are recommended? The items listed may not be a complete list. Talk with your dietitian about what dietary choices are best for you. Grains Low-sodium cereals, including oats, puffed wheat and rice, and shredded wheat. Low-sodium crackers. Unsalted rice. Unsalted pasta. Low-sodium bread. Whole-grain breads and whole-grain pasta. Vegetables Fresh or frozen vegetables. "No salt added" canned vegetables. "No salt added" tomato sauce and paste. Low-sodium or reduced-sodium tomato and vegetable juice. Fruits Fresh, frozen, or canned fruit. Fruit juice. Meats and other protein foods Fresh or frozen (no salt added) meat, poultry, seafood, and fish. Low-sodium canned tuna and salmon. Unsalted nuts. Dried peas, beans, and lentils without added salt. Unsalted canned beans. Eggs. Unsalted nut butters. Dairy Milk. Soy milk. Cheese that is naturally low in sodium, such as ricotta cheese, fresh mozzarella, or Swiss cheese Low-sodium or reduced-sodium cheese. Cream cheese. Yogurt. Fats and oils Unsalted butter. Unsalted margarine with no trans fat. Vegetable oils such as canola or olive oils. Seasonings and other foods Fresh and dried herbs and spices. Salt-free seasonings. Low-sodium mustard and  ketchup. Sodium-free salad dressing. Sodium-free light mayonnaise. Fresh or refrigerated horseradish. Lemon juice. Vinegar. Homemade, reduced-sodium, or low-sodium soups. Unsalted popcorn and pretzels. Low-salt  or salt-free chips. What foods are not recommended? The items listed may not be a complete list. Talk with your dietitian about what dietary choices are best for you. Grains Instant hot cereals. Bread stuffing, pancake, and biscuit mixes. Croutons. Seasoned rice or pasta mixes. Noodle soup cups. Boxed or frozen macaroni and cheese. Regular salted crackers. Self-rising flour. Vegetables Sauerkraut, pickled vegetables, and relishes. Olives. [place] fries. Onion rings. Regular canned vegetables (not low-sodium or reduced-sodium). Regular canned tomato sauce and paste (not low-sodium or reduced-sodium). Regular tomato and vegetable juice (not low-sodium or reduced-sodium). Frozen vegetables in sauces. Meats and other protein foods Meat or fish that is salted, canned, smoked, spiced, or pickled. Bacon, ham, sausage, hotdogs, corned beef, chipped beef, packaged lunch meats, salt pork, jerky, pickled herring, anchovies, regular canned tuna, sardines, salted nuts. Dairy Processed cheese and cheese spreads. Cheese curds. Blue cheese. Feta cheese. String cheese. Regular cottage cheese. Buttermilk. Canned milk. Fats and oils Salted butter. Regular margarine. Ghee. Bacon fat. Seasonings and other foods Onion salt, garlic salt, seasoned salt, table salt, and sea salt. Canned and packaged gravies. Worcestershire sauce. Tartar sauce. Barbecue sauce. Teriyaki sauce. Soy sauce, including reduced-sodium. Steak sauce. Fish sauce. Oyster sauce. Cocktail sauce. Horseradish that you find on the shelf. Regular ketchup and mustard. Meat flavorings and tenderizers. Bouillon cubes. Hot sauce and Tabasco sauce. Premade or packaged marinades. Premade or packaged taco seasonings. Relishes. Regular salad dressings. Salsa. Potato and tortilla chips. Corn chips and puffs. Salted popcorn and pretzels. Canned or dried soups. Pizza. Frozen entrees and pot pies. Summary  Eating less sodium can help lower your blood pressure, reduce  swelling, and protect your heart, liver, and kidneys.  Most people on this plan should limit their sodium intake to 1,500-2,000 mg (milligrams) of sodium each day.  Canned, boxed, and frozen foods are high in sodium. Restaurant foods, fast foods, and pizza are also very high in sodium. You also get sodium by adding salt to food.  Try to cook at home, eat more fresh fruits and vegetables, and eat less fast food, canned, processed, or prepared foods. This information is not intended to replace advice given to you by your health care provider. Make sure you discuss any questions you have with your health care provider. Document Released: 08/02/2001 Document Revised: 02/04/2016 Document Reviewed: 02/04/2016 Elsevier Interactive Patient Education  [employer].

## 2017-04-22 NOTE — Progress Notes (Signed)
Cardiology Office Note:    Date:  04/22/2017   ID:  Teresa Liu, DOB Jul 19, 1975, MRN [id number]  PCP:  Kelton Pillar, MD  Cardiologist:  Mertie Moores, MD   Referring MD: Kelton Pillar, MD   Chief Complaint  Patient presents with  . Follow-up    BP    History of Present Illness:    Teresa Liu is a 42 y.o. female who is treated for hypertension and sinus tachycardia and is s/p resection for a meningioma in 2012.  Event monitor in 2017 did not demonstrate any significant arrhythmias.  Echocardiogram in 2017 demonstrated normal LV function with moderate diastolic dysfunction.  She was last seen by Dr. Acie Fredrickson 02/2016.  [name] returns for follow up.  She is here alone. She is overall doing well.  She notes some palpitations from time to time.  This are much less frequent than they used to be.  She denies chest pain, shortness of breath, syncope, orthopnea, PND or significant pedal edema.   Prior CV studies:   The following studies were reviewed today:  Echo 03/23/15 EF 00-93, grade 2 diastolic dysfunction  Event monitor 03/23/15 Sinus rhythm  No arrhythmias noted Symptoms of palpitations did not correlate with arrhythmia.    Past Medical History:  Diagnosis Date  . HTN (hypertension) 08/20/2015  . Meningioma ([place])   . Migraines     Past Surgical History:  Procedure Laterality Date  . CRANIOTOMY FOR TUMOR  08/26/2010   right frontal craniotomy, gross total resection of brain tumor    Current Medications: Current Meds  Medication Sig  . flurbiprofen (ANSAID) 100 MG tablet Take 100 mg by mouth 2 (two) times daily.  Marland Kitchen HYDROcodone-acetaminophen (NORCO/VICODIN) 5-325 MG tablet Take 1 tablet by mouth as needed. AS NEEDED FOR PAIN  . naratriptan (AMERGE) 2.5 MG tablet Take 2.5 mg by mouth as needed. AS NEEDED FOR MIGRAINE  . NUVARING 0.12-0.015 MG/24HR vaginal ring Place 1 each vaginally every 28 (twenty-eight) days.   Marland Kitchen topiramate (TOPAMAX) 25 MG capsule Take  25 mg by mouth daily.     Allergies:   Patient has no known allergies.   Social History   Tobacco Use  . Smoking status: Never Smoker  . Smokeless tobacco: Never Used  Substance Use Topics  . Alcohol use: No  . Drug use: No     Family Hx: The patient's family history includes Cancer in her mother; Colon cancer in her paternal grandmother; Diabetes in her maternal aunt, maternal uncle, paternal aunt, and paternal uncle; Glaucoma in her maternal grandmother; Hypertension in her father. There is no history of Heart attack.  ROS:   Please see the history of present illness.    Review of Systems  Cardiovascular: Positive for irregular heartbeat.   All other systems reviewed and are negative.   EKGs/Labs/Other Test Reviewed:    EKG:  EKG is  ordered today.  The ekg ordered today demonstrates normal sinus rhythm, heart rate 65, normal axis, QTC 443 ms  Recent Labs: No results found for requested labs within last 8760 hours.   Recent Lipid Panel No results found for: CHOL, TRIG, HDL, CHOLHDL, LDLCALC, LDLDIRECT  Physical Exam:    VS:  BP 130/80   Pulse 64   Ht 5' 3.5" (1.613 m)   Wt 195 lb 12.8 oz (88.8 kg)   SpO2 99%   BMI 34.14 kg/m     Wt Readings from Last 3 Encounters:  04/22/17 195 lb 12.8 oz (88.8 kg)  07/31/16  195 lb (88.5 kg)  03/03/16 190 lb 12.8 oz (86.5 kg)     Physical Exam  Constitutional: She is oriented to person, place, and time. She appears well-developed and well-nourished. No distress.  HENT:  Head: Normocephalic and atraumatic.  Neck: No JVD present. Carotid bruit is not present. No thyromegaly present.  Cardiovascular: Normal rate and regular rhythm.  No murmur heard. Pulmonary/Chest: Effort normal. She has no rales.  Abdominal: Soft.  Musculoskeletal: She exhibits no edema.  Neurological: She is alert and oriented to person, place, and time.  Skin: Skin is warm and dry.    ASSESSMENT & PLAN:    1. Essential hypertension  Her blood  pressure is close to target.  I have recommended she continue current dose of beta-blocker.  I have also recommended she work on reducing dietary sodium as well as increase activity and weight loss.  She should reach BP target (<130/80) with this regimen.  She can follow up in 1 year.   Dispo:  Return in about 1 year (around 04/22/2018) for Routine Follow Up, w/ Dr. Acie Fredrickson, or Richardson Dopp, PA-C.   Medication Adjustments/Labs and Tests Ordered: Current medicines are reviewed at length with the patient today.  Concerns regarding medicines are outlined above.  Tests Ordered: Orders Placed This Encounter  Procedures  . EKG 12-Lead   Medication Changes: Meds ordered this encounter  Medications  . metoprolol succinate (TOPROL-XL) 100 MG 24 hr tablet    Sig: Take 1 tablet (100 mg total) by mouth daily. Take with or immediately following a meal.    Dispense:  90 tablet    Refill:  3    Order Specific Question:   Supervising Provider    Answer:   Thompson Grayer [[id number]]    Signed, Richardson Dopp, PA-C  04/22/2017 12:50 PM    [facility] [place], [place], [place]  [address] Phone: [phone]; Fax: [phone]

## 2017-04-30 ENCOUNTER — Ambulatory Visit (INDEPENDENT_AMBULATORY_CARE_PROVIDER_SITE_OTHER): Payer: PRIVATE HEALTH INSURANCE | Admitting: Licensed Clinical Social Worker

## 2017-04-30 DIAGNOSIS — F324 Major depressive disorder, single episode, in partial remission: Secondary | ICD-10-CM

## 2017-08-03 ENCOUNTER — Encounter: Payer: 59 | Admitting: Obstetrics & Gynecology

## 2017-08-04 ENCOUNTER — Telehealth: Payer: Self-pay | Admitting: *Deleted

## 2017-08-04 NOTE — Telephone Encounter (Signed)
Patient called stating her cycle started today, asked if she should reschedule annual exam tomorrow. I called back and got her voicemail, I left message on voicemail, stating last pap in 2018, she may not do pap, can kept or reschedule depending on cycle flow, to call and cancel if she wishes.

## 2017-08-05 ENCOUNTER — Ambulatory Visit (INDEPENDENT_AMBULATORY_CARE_PROVIDER_SITE_OTHER): Payer: 59 | Admitting: Obstetrics & Gynecology

## 2017-08-05 ENCOUNTER — Encounter: Payer: Self-pay | Admitting: Obstetrics & Gynecology

## 2017-08-05 VITALS — BP 136/88 | Ht 63.25 in | Wt 199.0 lb

## 2017-08-05 DIAGNOSIS — Z3044 Encounter for surveillance of vaginal ring hormonal contraceptive device: Secondary | ICD-10-CM

## 2017-08-05 DIAGNOSIS — E6609 Other obesity due to excess calories: Secondary | ICD-10-CM

## 2017-08-05 DIAGNOSIS — Z6834 Body mass index (BMI) 34.0-34.9, adult: Secondary | ICD-10-CM | POA: Diagnosis not present

## 2017-08-05 DIAGNOSIS — Z01419 Encounter for gynecological examination (general) (routine) without abnormal findings: Secondary | ICD-10-CM

## 2017-08-05 MED ORDER — NUVARING 0.12-0.015 MG/24HR VA RING: 1.0000 | VAGINAL_RING | VAGINAL | 4 refills | Status: DC

## 2017-08-05 NOTE — Progress Notes (Signed)
Teresa Liu Feb 04, 1976 [id number]   History:    42 y.o. G2P2L2  RP:  Established patient presenting for annual gyn exam   HPI: Well on NuvaRing.  No breakthrough bleeding.  No pelvic pain.  Normal vaginal secretions.  No pain with intercourse.  Urine and bowel movements normal.  Breasts normal.  Body mass index 34.97.  Health labs with family physician.  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  Followed by neurology.  Past medical history,surgical history, family history and social history were all reviewed and documented in the EPIC chart.  Gynecologic History Patient's last menstrual period was 08/04/2017. Contraception: NuvaRing vaginal inserts Last Pap: 07/2016. Results were: Negative/HPV HR neg Last mammogram: Never Bone Density: Never Colonoscopy: Never  Obstetric History OB History  Gravida Para Term Preterm AB Living  2 2       2   SAB TAB Ectopic Multiple Live Births               # Outcome Date GA Lbr Len/2nd Weight Sex Delivery Anes PTL Lv  2 Para           1 Para              ROS: A ROS was performed and pertinent positives and negatives are included in the history.  GENERAL: No fevers or chills. HEENT: No change in vision, no earache, sore throat or sinus congestion. NECK: No pain or stiffness. CARDIOVASCULAR: No chest pain or pressure. No palpitations. PULMONARY: No shortness of breath, cough or wheeze. GASTROINTESTINAL: No abdominal pain, nausea, vomiting or diarrhea, melena or bright red blood per rectum. GENITOURINARY: No urinary frequency, urgency, hesitancy or dysuria. MUSCULOSKELETAL: No joint or muscle pain, no back pain, no recent trauma. DERMATOLOGIC: No rash, no itching, no lesions. ENDOCRINE: No polyuria, polydipsia, no heat or cold intolerance. No recent change in weight. HEMATOLOGICAL: No anemia or easy bruising or bleeding. NEUROLOGIC: No headache, seizures, numbness, tingling or weakness. PSYCHIATRIC: No depression, no loss of  interest in normal activity or change in sleep pattern.     Exam:   BP 136/88   Ht 5' 3.25" (1.607 m)   Wt 199 lb (90.3 kg)   LMP 08/04/2017 Comment: NUVARING   BMI 34.97 kg/m   Body mass index is 34.97 kg/m.  General appearance : Well developed well nourished female. No acute distress HEENT: Eyes: no retinal hemorrhage or exudates,  Neck supple, trachea midline, no carotid bruits, no thyroidmegaly Lungs: Clear to auscultation, no rhonchi or wheezes, or rib retractions  Heart: Regular rate and rhythm, no murmurs or gallops Breast:Examined in sitting and supine position were symmetrical in appearance, no palpable masses or tenderness,  no skin retraction, no nipple inversion, no nipple discharge, no skin discoloration, no axillary or supraclavicular lymphadenopathy Abdomen: no palpable masses or tenderness, no rebound or guarding Extremities: no edema or skin discoloration or tenderness  Pelvic: Vulva: Normal             Vagina: No gross lesions or discharge  Cervix: No gross lesions or discharge  Uterus  AV, normal size, shape and consistency, non-tender and mobile  Adnexa  Without masses or tenderness  Anus: Normal   Assessment/Plan:  42 y.o. female for annual exam   1. Well female exam with routine gynecological exam Normal gynecologic exam.  Last Pap test June 2018 was negative with negative high-risk HPV.  Breast exam normal.  Will start screening mammogram at 42.  Health labs with family physician.  2. Encounter for surveillance of vaginal ring hormonal contraceptive device Doing very well on NuvaRing.  No contraindication.  NuvaRing represcribed.  3. Class 1 obesity due to excess calories without serious comorbidity with body mass index (BMI) of 34.0 to 34.9 in adult Recommend low calorie/low carb diet, such as [employer].  I aerobic physical activity 5 times a week and weightlifting every 2 days.  Other orders - NUVARING 0.12-0.015 MG/24HR vaginal ring; Place  1 each vaginally every 28 (twenty-eight) days.  Princess Bruins MD, 2:46 PM 08/05/2017

## 2017-08-08 ENCOUNTER — Encounter: Payer: Self-pay | Admitting: Obstetrics & Gynecology

## 2017-08-08 NOTE — Patient Instructions (Signed)
1. Well female exam with routine gynecological exam Normal gynecologic exam.  Last Pap test June 2018 was negative with negative high-risk HPV.  Breast exam normal.  Will start screening mammogram at 42.  Health labs with family physician.  2. Encounter for surveillance of vaginal ring hormonal contraceptive device Doing very well on NuvaRing.  No contraindication.  NuvaRing represcribed.  3. Class 1 obesity due to excess calories without serious comorbidity with body mass index (BMI) of 34.0 to 34.9 in adult Recommend low calorie/low carb diet, such as [employer].  I aerobic physical activity 5 times a week and weightlifting every 2 days.  Other orders - NUVARING 0.12-0.015 MG/24HR vaginal ring; Place 1 each vaginally every 28 (twenty-eight) days.  Teresa Liu, it was a pleasure seeing you today!

## 2017-09-29 ENCOUNTER — Other Ambulatory Visit: Payer: Self-pay | Admitting: Obstetrics & Gynecology

## 2018-06-12 ENCOUNTER — Other Ambulatory Visit: Payer: Self-pay | Admitting: Physician Assistant

## 2018-08-06 ENCOUNTER — Other Ambulatory Visit: Payer: Self-pay

## 2018-08-09 ENCOUNTER — Other Ambulatory Visit: Payer: Self-pay | Admitting: Obstetrics & Gynecology

## 2018-08-09 ENCOUNTER — Encounter: Payer: Self-pay | Admitting: Obstetrics & Gynecology

## 2018-08-09 ENCOUNTER — Other Ambulatory Visit: Payer: Self-pay

## 2018-08-09 ENCOUNTER — Other Ambulatory Visit: Payer: Self-pay | Admitting: Family Medicine

## 2018-08-09 ENCOUNTER — Ambulatory Visit (INDEPENDENT_AMBULATORY_CARE_PROVIDER_SITE_OTHER): Payer: 59 | Admitting: Obstetrics & Gynecology

## 2018-08-09 VITALS — BP 130/80 | Ht 64.0 in | Wt 208.0 lb

## 2018-08-09 DIAGNOSIS — Z01419 Encounter for gynecological examination (general) (routine) without abnormal findings: Secondary | ICD-10-CM

## 2018-08-09 DIAGNOSIS — E6609 Other obesity due to excess calories: Secondary | ICD-10-CM

## 2018-08-09 DIAGNOSIS — Z1231 Encounter for screening mammogram for malignant neoplasm of breast: Secondary | ICD-10-CM

## 2018-08-09 DIAGNOSIS — Z6835 Body mass index (BMI) 35.0-35.9, adult: Secondary | ICD-10-CM

## 2018-08-09 DIAGNOSIS — Z3044 Encounter for surveillance of vaginal ring hormonal contraceptive device: Secondary | ICD-10-CM | POA: Diagnosis not present

## 2018-08-09 MED ORDER — NUVARING 0.12-0.015 MG/24HR VA RING: 1.0000 | VAGINAL_RING | VAGINAL | 4 refills | Status: DC

## 2018-08-09 NOTE — Progress Notes (Signed)
Teresa Liu Sep 03, 1975 [id number]   History:    43 y.o. G2P2L2 Married.  Works at [employer].  Daughter graduated from [employer] this year, going to [employer].  Son in 3rd grade.  RP:  Established patient presenting for annual gyn exam   HPI: Well on Nuvaring.  No BTB.  No pelvic pain.  No pain with IC.  Urine/BMs normal.  Breasts normal.  BMI 35.70.  Needs to increase physical activity.  Health labs with Fam MD.  Past medical history,surgical history, family history and social history were all reviewed and documented in the EPIC chart.  Gynecologic History Patient's last menstrual period was 08/05/2018. Contraception: NuvaRing vaginal inserts Last Pap: 07/2016. Results were: Negative/HPV HR neg Last mammogram: Never Bone Density: Never Colonoscopy: Never  Obstetric History OB History  Gravida Para Term Preterm AB Living  2 2       2   SAB TAB Ectopic Multiple Live Births               # Outcome Date GA Lbr Len/2nd Weight Sex Delivery Anes PTL Lv  2 Para           1 Para              ROS: A ROS was performed and pertinent positives and negatives are included in the history.  GENERAL: No fevers or chills. HEENT: No change in vision, no earache, sore throat or sinus congestion. NECK: No pain or stiffness. CARDIOVASCULAR: No chest pain or pressure. No palpitations. PULMONARY: No shortness of breath, cough or wheeze. GASTROINTESTINAL: No abdominal pain, nausea, vomiting or diarrhea, melena or bright red blood per rectum. GENITOURINARY: No urinary frequency, urgency, hesitancy or dysuria. MUSCULOSKELETAL: No joint or muscle pain, no back pain, no recent trauma. DERMATOLOGIC: No rash, no itching, no lesions. ENDOCRINE: No polyuria, polydipsia, no heat or cold intolerance. No recent change in weight. HEMATOLOGICAL: No anemia or easy bruising or bleeding. NEUROLOGIC: No headache, seizures, numbness, tingling or weakness. PSYCHIATRIC: No depression, no loss of interest in normal activity or change in  sleep pattern.     Exam:   BP 130/80   Ht 5\' 4"  (1.626 m)   Wt 208 lb (94.3 kg)   LMP 08/05/2018 Comment: nuvaring  BMI 35.70 kg/m   Body mass index is 35.7 kg/m.  General appearance : Well developed well nourished female. No acute distress HEENT: Eyes: no retinal hemorrhage or exudates,  Neck supple, trachea midline, no carotid bruits, no thyroidmegaly Lungs: Clear to auscultation, no rhonchi or wheezes, or rib retractions  Heart: Regular rate and rhythm, no murmurs or gallops Breast:Examined in sitting and supine position were symmetrical in appearance, no palpable masses or tenderness,  no skin retraction, no nipple inversion, no nipple discharge, no skin discoloration, no axillary or supraclavicular lymphadenopathy Abdomen: no palpable masses or tenderness, no rebound or guarding Extremities: no edema or skin discoloration or tenderness  Pelvic: Vulva: Normal             Vagina: No gross lesions or discharge  Cervix: No gross lesions or discharge.  Pap reflex done  Uterus  AV, normal size, shape and consistency, non-tender and mobile  Adnexa  Without masses or tenderness  Anus: Normal   Assessment/Plan:  43 y.o. female for annual exam   1. Encounter for routine gynecological examination with Papanicolaou smear of cervix Normal gynecologic exam.  Pap reflex done.  Breast exam normal.  Schedule first screening Mammo now.  Health labs with Fam MD.  2. Encounter for surveillance of vaginal ring hormonal contraceptive device Well on Nuvaring.  No CI to continue.  Prescription sent to pharmacy.  3. Class 2 obesity due to excess calories without serious comorbidity with body mass index (BMI) of 35.0 to 35.9 in adult Recommend low calorie/carb diet such as [employer].  Aerobic activities 5 times weekly, weight lifting every 2 days.  Other orders - NUVARING 0.12-0.015 MG/24HR vaginal ring; Place 1 each vaginally every 28 (twenty-eight) days.  Princess Bruins MD, 2:46  PM 08/09/2018

## 2018-08-11 ENCOUNTER — Encounter: Payer: Self-pay | Admitting: Obstetrics & Gynecology

## 2018-08-11 LAB — PAP IG W/ RFLX HPV ASCU

## 2018-08-11 NOTE — Patient Instructions (Signed)
1. Encounter for routine gynecological examination with Papanicolaou smear of cervix Normal gynecologic exam.  Pap reflex done.  Breast exam normal.  Schedule first screening Mammo now.  Health labs with Fam MD.  2. Encounter for surveillance of vaginal ring hormonal contraceptive device Well on Nuvaring.  No CI to continue.  Prescription sent to pharmacy.  3. Class 2 obesity due to excess calories without serious comorbidity with body mass index (BMI) of 35.0 to 35.9 in adult Recommend low calorie/carb diet such as [employer].  Aerobic activities 5 times weekly, weight lifting every 2 days.  Other orders - NUVARING 0.12-0.015 MG/24HR vaginal ring; Place 1 each vaginally every 28 (twenty-eight) days.  Casidee, it was a pleasure seeing you today!  I will inform you of your results as soon as they are available.

## 2018-09-22 ENCOUNTER — Other Ambulatory Visit: Payer: Self-pay

## 2018-09-22 ENCOUNTER — Ambulatory Visit
Admission: RE | Admit: 2018-09-22 | Discharge: 2018-09-22 | Disposition: A | Discharge status: Home / Self Care | Payer: 59 | Source: Ambulatory Visit | Attending: Obstetrics & Gynecology | Admitting: Obstetrics & Gynecology

## 2018-09-22 DIAGNOSIS — Z1231 Encounter for screening mammogram for malignant neoplasm of breast: Secondary | ICD-10-CM

## 2018-10-31 ENCOUNTER — Other Ambulatory Visit: Payer: Self-pay | Admitting: Physician Assistant

## 2018-11-28 ENCOUNTER — Other Ambulatory Visit: Payer: Self-pay | Admitting: Physician Assistant

## 2018-12-20 DIAGNOSIS — L409 Psoriasis, unspecified: Secondary | ICD-10-CM | POA: Insufficient documentation

## 2019-01-04 ENCOUNTER — Other Ambulatory Visit: Payer: Self-pay

## 2019-01-04 ENCOUNTER — Other Ambulatory Visit (HOSPITAL_COMMUNITY): Payer: PRIVATE HEALTH INSURANCE | Attending: Psychiatry | Admitting: Psychiatry

## 2019-01-04 ENCOUNTER — Telehealth (HOSPITAL_COMMUNITY): Payer: Self-pay | Admitting: Psychiatry

## 2019-01-04 DIAGNOSIS — Z8249 Family history of ischemic heart disease and other diseases of the circulatory system: Secondary | ICD-10-CM | POA: Insufficient documentation

## 2019-01-04 DIAGNOSIS — Z793 Long term (current) use of hormonal contraceptives: Secondary | ICD-10-CM | POA: Insufficient documentation

## 2019-01-04 DIAGNOSIS — F329 Major depressive disorder, single episode, unspecified: Secondary | ICD-10-CM | POA: Insufficient documentation

## 2019-01-04 DIAGNOSIS — F419 Anxiety disorder, unspecified: Secondary | ICD-10-CM | POA: Insufficient documentation

## 2019-01-04 DIAGNOSIS — Z79899 Other long term (current) drug therapy: Secondary | ICD-10-CM | POA: Insufficient documentation

## 2019-01-04 DIAGNOSIS — R45851 Suicidal ideations: Secondary | ICD-10-CM | POA: Insufficient documentation

## 2019-01-04 DIAGNOSIS — Z800 Family history of malignant neoplasm of digestive organs: Secondary | ICD-10-CM | POA: Insufficient documentation

## 2019-01-04 DIAGNOSIS — I10 Essential (primary) hypertension: Secondary | ICD-10-CM | POA: Insufficient documentation

## 2019-01-04 DIAGNOSIS — Z833 Family history of diabetes mellitus: Secondary | ICD-10-CM | POA: Insufficient documentation

## 2019-01-04 NOTE — Telephone Encounter (Signed)
D:  Pt phoned requesting to start [place].  Reports Dr. Chucky May referred her.  A:  Oriented pt.  Scheduled to do a CCA with pt today at 1pm.  R:  Pt receptive.

## 2019-01-04 NOTE — Progress Notes (Signed)
Comprehensive Clinical Assessment (CCA) Note  01/04/2019 Teresa Liu [id number]  Visit Diagnosis:   No diagnosis found.    CCA Part One  Part One has been completed on paper by the patient.  (See scanned document in Chart Review)  CCA Part Two A  Intake/Chief Complaint:  CCA Intake With Chief Complaint CCA Part Two Date: 01/04/19 CCA Part Two Time: [id number] Chief Complaint/Presenting Problem: This is a 43 yr old, married,employed, Teresa Liu, who was referred per Dr. Chucky May; treatment for worsening anxiety and depressive symptoms.  States she's been seeing Dr. Toy Care since Feb. 2020; with a hx of seeing a therapist.  Multiple Stressors:  1) Job ([employer]) of 20 yrs.  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  "There's been a lot of changes.  They want [place] to sell all the time."  Pt reports conflict with her mgr.  "He doesn't care."  States she feels stuck at the job because of the good benefits and pay.  Dr. Toy Care pulled pt out of work in August 2020.  2)  Unresolved grief/loss issues:  Mother passed in 2007.  "She was my rock."  3) 30 yr old son in virtual school at home.  4)  Conflictual relationship with 52 yr old daughter.  "I feel we missed out on time together because of her chaotic high school years."  According to pt, daughter was very disrespectful during that time.  She had ran away at one time.  She is currently residing on campus at [facility].  "I wish we had a better relationship."  Pt denies any inpatient hospitalizations.  Denies hx of suicide attempts or gestures. Patients Currently Reported Symptoms/Problems: increased anxiety, poor sleep, ruminating thoughts, poor concentration, tearful, poor energy, anhedonia, fatigue, sadness, poor self-esteem Collateral Involvement: Pt states her father and friends are supportive. Individual's Strengths: "I am nice person who is usually the mediator." Individual's Preferences: "I want to work on time management." Type of Services Patient  Feels Are Needed: MH-IOP Initial Clinical Notes/Concerns: Pt states her hair is falling out d/t stress; Also has psoriasis that has flarred up.  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  Mental Health Symptoms Depression:  Depression: Change in energy/activity, Difficulty Concentrating, Fatigue, Increase/decrease in appetite, Sleep (too much or little), Tearfulness, Irritability  Mania:  Mania: N/A  Anxiety:   Anxiety: Difficulty concentrating, Worrying, Tension  Psychosis:  Psychosis: N/A  Trauma:  Trauma: N/A  Obsessions:  Obsessions: N/A  Compulsions:  Compulsions: N/A  Inattention:  Inattention: N/A  Hyperactivity/Impulsivity:  Hyperactivity/Impulsivity: N/A  Oppositional/Defiant Behaviors:  Oppositional/Defiant Behaviors: N/A  Borderline Personality:  Emotional Irregularity: N/A  Other Mood/Personality Symptoms:      Mental Status Exam Appearance and self-care  Stature:  Stature: Average  Weight:  Weight: Overweight  Clothing:  Clothing: Casual  Grooming:  Grooming: Normal  Cosmetic use:  Cosmetic Use: None  Posture/gait:  Posture/Gait: Normal  Motor activity:  Motor Activity: Not Remarkable  Sensorium  Attention:  Attention: Normal  Concentration:  Concentration: Normal  Orientation:  Orientation: X5  Recall/memory:  Recall/Memory: Normal  Affect and Mood  Affect:  Affect: Tearful  Mood:  Mood: Depressed  Relating  Eye contact:  Eye Contact: Normal  Facial expression:  Facial Expression: Sad  Attitude toward examiner:  Attitude Toward Examiner: Cooperative  Thought and Language  Speech flow: Speech Flow: Normal  Thought content:  Thought Content: Appropriate to mood and circumstances  Preoccupation:     Hallucinations:     Organization:     Executive  Functions  Fund of Knowledge:  Fund of Knowledge: Average  Intelligence:  Intelligence: Average  Abstraction:  Abstraction: Normal  Judgement:  Judgement: Normal  Reality Testing:  Reality Testing: Adequate  Insight:  Insight: Good   Decision Making:  Decision Making: Vacilates  Social Functioning  Social Maturity:  Social Maturity: Isolates  Social Judgement:  Social Judgement: Normal  Stress  Stressors:  Stressors: Brewing technologist, Family conflict, Work  Coping Ability:  Coping Ability: English as a second language teacher Deficits:     Supports:      Family and Psychosocial History: Family history Marital status: Married Number of Years Married: 75 What types of issues is patient dealing with in the relationship?: "I don't think he understands my illness." What is your sexual orientation?: heterosexual Does patient have children?: Yes How many children?: 2 How is patient's relationship with their children?: Strained relationship with 36 yr old daughter; also has a 25 yr old son  Childhood History:  Childhood History By whom was/is the patient raised?: Both parents Additional childhood history information: Born and raised in [place], [place].  Reports "good childhood."  "Our parents loved [place]."  States she had no problems in school.  Denies any trauma or abuse. Description of patient's relationship with caregiver when they were a child: Very close to mother. Patient's description of current relationship with people who raised him/her: Mother deceased; but father has remarried two yrs ago.  According to pt, this made her very angry.  "His marriage isn't going well." Does patient have siblings?: Yes Number of Siblings: 2 Description of patient's current relationship with siblings: Close to older sister and older brother Did patient suffer any verbal/emotional/physical/sexual abuse as a child?: No Did patient suffer from severe childhood neglect?: No Has patient ever been sexually abused/assaulted/raped as an adolescent or adult?: No Was the patient ever a victim of a crime or a disaster?: No Witnessed domestic violence?: No Has patient been effected by domestic violence as an adult?: No  CCA Part Two B  Employment/Work  Situation: Employment / Work Copywriter, advertising Employment situation: Employed Where is patient currently employed?: [employer] How long has patient been employed?: 73 yrs Patient's job has been impacted by current illness: Yes Describe how patient's job has been impacted: Not able to function Did You Receive Any Psychiatric Treatment/Services While in the [employer]?: No Are There Guns or Other Weapons in [place]?: No  Education: Education Did Teacher, adult education From [employer]?: Yes Did Physicist, medical?: Yes What Type of College Degree Do you Have?: BA Did You Attend Graduate School?: No 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 Did You Have An Individualized Education Program (IIEP): No Did You Have Any Difficulty At School?: No  Religion: Religion/Spirituality Are You A Religious Person?: Yes What is Your Religious Affiliation?: Personal assistant: Leisure / Recreation Leisure and Hobbies: Watching son's games  Exercise/Diet: Exercise/Diet Do You Exercise?: No Have You Gained or Lost A Significant Amount of Weight in the Past Six Months?: No Do You Follow a Special Diet?: No Do You Have Any Trouble Sleeping?: Yes Explanation of Sleeping Difficulties: Difficulty staying asleep d/t ruminating thoughts  CCA Part Two C  Alcohol/Drug Use: Alcohol / Drug Use Pain Medications: cc:  MAR Prescriptions: Lexapro 20 mg and Abilify 5 mg Over the Counter: cc:  MAR History of alcohol / drug use?: No history of alcohol / drug abuse  CCA Part Three  ASAM's:  Six Dimensions of Multidimensional Assessment  Dimension 1:  Acute Intoxication and/or Withdrawal Potential:     Dimension 2:  Biomedical Conditions and Complications:     Dimension 3:  Emotional, Behavioral, or Cognitive Conditions and Complications:     Dimension 4:  Readiness to Change:     Dimension 5:  Relapse, Continued use, or Continued Problem Potential:     Dimension 6:  Recovery/Living  Environment:      Substance use Disorder (SUD)    Social Function:  Social Functioning Social Maturity: Isolates Social Judgement: Normal  Stress:  Stress Stressors: Grief/losses, Family conflict, Work Coping Ability: Overwhelmed Patient Takes Medications The Way The Doctor Instructed?: Yes Priority Risk: Moderate Risk  Risk Assessment- Self-Harm Potential: Risk Assessment For Self-Harm Potential Thoughts of Self-Harm: No current thoughts Method: No plan Availability of Means: No access/NA  Risk Assessment -Dangerous to Others Potential: Risk Assessment For Dangerous to Others Potential Method: No Plan Availability of Means: No access or NA Intent: Vague intent or NA Notification Required: No need or identified person  DSM5 Diagnoses: Patient Active Problem List   Diagnosis Date Noted  . HTN (hypertension) 08/20/2015  . Palpitations 03/14/2015  . 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  Pt gave verbal consent for treatment, to release chart information to referred providers and to complete any forms if needed.  Pt also gave consent for attending group virtually d/t COVID-19 social distancing restrictions.  Encouraged support groups.  F/U with Dr. Chucky May and will refer pt to a therapist.    Referrals to Alternative Service(s): Referred to Alternative Service(s):   Place:   Date:   Time:    Referred to Alternative Service(s):   Place:   Date:   Time:    Referred to Alternative Service(s):   Place:   Date:   Time:    Referred to Alternative Service(s):   Place:   Date:   Time:     Dellia Nims,  M.Ed,CNA  Virtual Visit via Video Note  I connected with [name] on 01/04/19 at  2:30 PM EST by a video enabled telemedicine application and verified that I am speaking with the correct person using two identifiers.  I discussed the limitations of evaluation and management by telemedicine and the availability of in person appointments. The patient expressed understanding and agreed to proceed. I discussed the assessment and treatment plan with the patient. The patient was provided an opportunity to ask questions and all were answered. The patient agreed with the plan and demonstrated an understanding of the instructions.  The patient was advised to call back or seek an in-person evaluation if the symptoms worsen or if the condition fails to improve as anticipated.  I provided 60 minutes of non-face-to-face time during this encounter.   Carlis Abbott, RITA, M.Ed,CNA

## 2019-01-11 ENCOUNTER — Other Ambulatory Visit: Payer: Self-pay

## 2019-01-11 ENCOUNTER — Encounter (HOSPITAL_COMMUNITY): Payer: Self-pay | Admitting: Psychiatry

## 2019-01-11 ENCOUNTER — Other Ambulatory Visit (HOSPITAL_COMMUNITY): Payer: PRIVATE HEALTH INSURANCE | Admitting: Family

## 2019-01-11 DIAGNOSIS — Z800 Family history of malignant neoplasm of digestive organs: Secondary | ICD-10-CM | POA: Diagnosis not present

## 2019-01-11 DIAGNOSIS — F411 Generalized anxiety disorder: Secondary | ICD-10-CM

## 2019-01-11 DIAGNOSIS — F419 Anxiety disorder, unspecified: Secondary | ICD-10-CM | POA: Diagnosis not present

## 2019-01-11 DIAGNOSIS — F322 Major depressive disorder, single episode, severe without psychotic features: Secondary | ICD-10-CM

## 2019-01-11 DIAGNOSIS — Z8249 Family history of ischemic heart disease and other diseases of the circulatory system: Secondary | ICD-10-CM | POA: Diagnosis not present

## 2019-01-11 DIAGNOSIS — Z79899 Other long term (current) drug therapy: Secondary | ICD-10-CM | POA: Diagnosis not present

## 2019-01-11 DIAGNOSIS — Z793 Long term (current) use of hormonal contraceptives: Secondary | ICD-10-CM | POA: Diagnosis not present

## 2019-01-11 DIAGNOSIS — R45851 Suicidal ideations: Secondary | ICD-10-CM | POA: Diagnosis not present

## 2019-01-11 DIAGNOSIS — Z833 Family history of diabetes mellitus: Secondary | ICD-10-CM | POA: Diagnosis not present

## 2019-01-11 DIAGNOSIS — F329 Major depressive disorder, single episode, unspecified: Secondary | ICD-10-CM | POA: Diagnosis present

## 2019-01-11 DIAGNOSIS — I10 Essential (primary) hypertension: Secondary | ICD-10-CM | POA: Diagnosis not present

## 2019-01-11 NOTE — Progress Notes (Signed)
Psychiatric Initial Adult Assessment   Patient Identification: Teresa Liu MRN:  [id number] Date of Evaluation:  01/11/2019 Referral Source: MD Rupinder Edwyna Perfect Chief Complaint:  Worsening depression and anxiety. Visit Diagnosis: No diagnosis found.  History of Present Illness: Teresa Liu 43 year old African-American female presents with worsening depression and anxiety.  Patient reports multiple stressors related to home work life.  She reports she has been employed by [employer] for the past 20 years and due to Covid has experienced a change in the working environment which has become overwhelming for her.  Reported grief and loss related to the passing of her mother.  States strained relationship between she and her oldest daughter.  Patient to start intensive outpatient programming on 01/11/2019   Patient reported For 65-year-old.  As she is attempted to work on the relationship between she and her daughter.  She reports daughter is currently residing on campus.  [name] stated  " disability upon losing control at times."  Reports her husband has been supportive.   [name] denied history of substance abuse or illicit drug use.  Denied family history of mental illness.  Denies history of physical or sexual abuse in the past.  Denies previous inpatient admissions or self injures behaviors. Reports she is currently followed by R. Edwyna Perfect  where she is prescribed Abilify 5 mg and Lexapro 20 mg she reports taking and tolerating medications well.     Associated Signs/Symptoms: Depression Symptoms:  depressed mood, feelings of worthlessness/guilt, difficulty concentrating, suicidal thoughts without plan, anxiety, (Hypo) Manic Symptoms:  Distractibility, Impulsivity, Irritable Mood, Anxiety Symptoms:  Excessive Worry, Psychotic Symptoms:  Hallucinations: None PTSD Symptoms: NA  Past Psychiatric History:   Previous Psychotropic Medications: No   Substance Abuse History in the last 12  months:  No.  Consequences of Substance Abuse: NA  Past Medical History:  Past Medical History:  Diagnosis Date  . HTN (hypertension) 08/20/2015  . Meningioma ([place])   . Migraines     Past Surgical History:  Procedure Laterality Date  . CRANIOTOMY FOR TUMOR  08/26/2010   right frontal craniotomy, gross total resection of brain tumor    Family Psychiatric History:   Family History:  Family History  Problem Relation Age of Onset  . Cancer Mother        pancreatic  . Hypertension Father   . Glaucoma Maternal Grandmother   . Colon cancer Paternal Grandmother   . Diabetes Maternal Aunt   . Diabetes Maternal Uncle   . Diabetes Paternal Aunt   . Diabetes Paternal Uncle   . Heart attack Neg Hx     Social History:   Social History   Socioeconomic History  . Marital status: Married    Spouse name: Not on file  . Number of children: Not on file  . Years of education: Not on file  . Highest education level: Not on file  Occupational History  . Not on file  Social Needs  . Financial resource strain: Not on file  . Food insecurity    Worry: Not on file    Inability: Not on file  . Transportation needs    Medical: Not on file    Non-medical: Not on file  Tobacco Use  . Smoking status: Never Smoker  . Smokeless tobacco: Never Used  Substance and Sexual Activity  . Alcohol use: No  . Drug use: No  . Sexual activity: Yes    Partners: Male    Birth control/protection: Inserts    Comment: 1st  intercourse- 21, partner-1, married- 10 yrs   Lifestyle  . Physical activity    Days per week: Not on file    Minutes per session: Not on file  . Stress: Not on file  Relationships  . Social Herbalist on phone: Not on file    Gets together: Not on file    Attends religious service: Not on file    Active member of club or organization: Not on file    Attends meetings of clubs or organizations: Not on file    Relationship status: Not on file  Other Topics Concern  .  Not on file  Social History Narrative  . Not on file    Additional Social History:  Allergies:  No Known Allergies  Metabolic Disorder Labs: No results found for: HGBA1C, MPG No results found for: PROLACTIN No results found for: CHOL, TRIG, HDL, CHOLHDL, VLDL, LDLCALC No results found for: TSH  Therapeutic Level Labs: No results found for: LITHIUM No results found for: CBMZ No results found for: VALPROATE  Current Medications: Current Outpatient Medications  Medication Sig Dispense Refill  . metoprolol succinate (TOPROL-XL) 100 MG 24 hr tablet Take 1 tablet (100 mg total) by mouth daily. Patient needs to call and schedule an appointment for further refills 3rd/final attempt 14 tablet 0  . NUVARING 0.12-0.015 MG/24HR vaginal ring Place 1 each vaginally every 28 (twenty-eight) days. 3 each 4   No current facility-administered medications for this visit.     Musculoskeletal:   Psychiatric Specialty Exam: ROS  There were no vitals taken for this visit.There is no height or weight on file to calculate BMI.  General Appearance: NA  Eye Contact:  NA  Speech:  Clear and Coherent  Volume:  Normal  Mood:  Anxious and Depressed  Affect:  Congruent  Thought Process:  Coherent  Orientation:  Full (Time, Place, and Person)  Thought Content:  Logical and Rumination  Suicidal Thoughts:  Yes.  with intent/plan  Homicidal Thoughts:  No  Memory:  Immediate;   Fair Recent;   Fair  Judgement:  Fair  Insight:  Fair  Psychomotor Activity:  Normal  Concentration:  Concentration: Fair  Recall:  [employer] of Knowledge:Fair  Language: Fair  Akathisia:  No  Handed:  Right  AIMS (if indicated):    Assets:  Communication Skills Desire for Improvement Resilience Social Support  ADL's:  Intact  Cognition: WNL  Sleep:  Fair   Screenings:   Assessment and Plan:  Admitted to Intensive Outpatient programing Continue medication as directed    Treatment plan was reviewed and  agreed upon by NP T. Bobby Rumpf and patient Afnan Mancinelli need for group services   Derrill Center, NP 11/17/202012:17 PM

## 2019-01-12 ENCOUNTER — Encounter (HOSPITAL_COMMUNITY): Payer: Self-pay

## 2019-01-12 ENCOUNTER — Encounter (HOSPITAL_COMMUNITY): Payer: Self-pay | Admitting: Family

## 2019-01-12 ENCOUNTER — Other Ambulatory Visit: Payer: Self-pay

## 2019-01-12 ENCOUNTER — Other Ambulatory Visit (HOSPITAL_COMMUNITY): Payer: PRIVATE HEALTH INSURANCE | Admitting: Psychiatry

## 2019-01-12 DIAGNOSIS — F329 Major depressive disorder, single episode, unspecified: Secondary | ICD-10-CM | POA: Diagnosis not present

## 2019-01-12 DIAGNOSIS — F411 Generalized anxiety disorder: Secondary | ICD-10-CM

## 2019-01-12 DIAGNOSIS — F322 Major depressive disorder, single episode, severe without psychotic features: Secondary | ICD-10-CM

## 2019-01-12 NOTE — Progress Notes (Addendum)
Virtual Visit via Video Note  I connected with Teresa Liu on 01/12/19 at  9:00 AM EST by a video enabled telemedicine application and verified that I am speaking with the correct person using two identifiers.  Location: Patient: Teresa Liu Provider: Lise Auer, LCSW   I discussed the limitations of evaluation and management by telemedicine and the availability of in person appointments. The patient expressed understanding and agreed to proceed.  History of Present Illness: MDD and GAD   Observations/Objective: Case Manager checked in with all participants to review discharge dates, insurance authorizations, work-related documents and needs for the treatment team. Counselor processed current mood and functioning and discussed how participants spent their time since last session and if skills were applied. Teresa Liu joined the treatment program today sharing about her current life stressors, her support system, her need for treatment and goals for treatment. She would like to learn how to better express her emotions, especially in a healthy way for her young son to see.  She reports that she "bottles up" emotions and is feeling the internal pressures of this behavior. Teresa Liu presents with high anxiety and severe depression.   Counselor introduced Agricultural consultant, Shade Flood, LCSW, [place] who presented on Coping Strategies for depression and practiced a mindfulness exercise for application. Group members participated in the discussion, gave feedback on their depression and strategies they would be willing to try or implement. Teresa Liu identified that she has tried some depression coping skills that have been helpful, but would like to make them more of a routine. She would like to set aside time to cry and emote in a way that doesn't negatively impact her son. She utilizes her faith and prayer in combating depression.  Counselor acknowledged two graduating group members, highlighting their  progress and growth during group. Each shared about their treatment takeaways, post-treatment plans and offered gratitude statements to remaining group members and counselor. Group members offered well wishes and encouragement as they transition out of group.   Assessment and Plan: Counselor recommends that patient remains in IOP treatment to better manage mental health symptoms and continue to address treatment plan goals. Counselor recommends adherence to crisis/safety plan, taking medications as prescribed and following up with medical professionals if any issues arise.   Follow Up Instructions: Counselor will send Webex link for next session.    I discussed the assessment and treatment plan with the patient. The patient was provided an opportunity to ask questions and all were answered. The patient agreed with the plan and demonstrated an understanding of the instructions.   The patient was advised to call back or seek an in-person evaluation if the symptoms worsen or if the condition fails to improve as anticipated.  I provided 180 minutes of non-face-to-face time during this encounter.   Lise Auer, LCSW

## 2019-01-12 NOTE — Progress Notes (Signed)
Virtual Visit via Video Note  I connected with [name] on 01/12/19 at  9:00 AM EST by a video enabled telemedicine application and verified that I am speaking with the correct person using two identifiers.  Location: Patient: Teresa Liu Provider: Lise Auer, LCSW   I discussed the limitations of evaluation and management by telemedicine and the availability of in person appointments. The patient expressed understanding and agreed to proceed.  History of Present Illness: MDD and GAD   Observations/Objective: Case Manager checked in with all participants to review discharge dates, insurance authorizations, work-related documents and needs for the treatment team. Counselor processed current mood and functioning and discussed how participants spent their time since last session and if skills were applied. Nashya reported being "ok" today. She opened up about her challenges with her son and virtual learning. She would like to get on more of a schedule and routine to feel less anxious and uncertain. Counselor offered therapeutic activities for her to work on this goal. Jaquelyn presents with moderate depression and high anxiety.   Counselor introduced our guest speaker, Einar Grad, [place] Pharmacist, who shared about psychiatric medications, side effects, treatment considerations and how to communicate with medical professionals. Each group member asked questions and shared medication concerns. Counselor prompted group members to reference a worksheet called, "Body Scan" to jot down questions and concerns about their physical health in preparation for their upcoming appointments with medical professionals. Counselor encouraged routine medical check-ups, preparing for appointments, following up with recommendations and seeking specialist if needed.   Counselor introduced a second Agricultural consultant, Demetra Shiner, Mud[employer] of Wellness at [employer] [employer], who presented on Self-Care and Overall Wellness.  Group members participated in activities and presentation, asking questions and giving feedback throughout. Group members identified Self-care goals and will complete for homework to discuss at the next session.   Assessment and Plan: Counselor recommends that patient remains in IOP treatment to better manage mental health symptoms and continue to address treatment plan goals. Counselor recommends adherence to crisis/safety plan, taking medications as prescribed and following up with medical professionals if any issues arise.   Follow Up Instructions: Counselor will send Webex link for next session.    I discussed the assessment and treatment plan with the patient. The patient was provided an opportunity to ask questions and all were answered. The patient agreed with the plan and demonstrated an understanding of the instructions.   The patient was advised to call back or seek an in-person evaluation if the symptoms worsen or if the condition fails to improve as anticipated.  I provided 180 minutes of non-face-to-face time during this encounter.   Lise Auer, LCSW

## 2019-01-13 ENCOUNTER — Other Ambulatory Visit: Payer: Self-pay

## 2019-01-13 ENCOUNTER — Encounter (HOSPITAL_COMMUNITY): Payer: Self-pay

## 2019-01-13 ENCOUNTER — Other Ambulatory Visit (HOSPITAL_COMMUNITY): Payer: PRIVATE HEALTH INSURANCE | Admitting: Psychiatry

## 2019-01-13 DIAGNOSIS — F411 Generalized anxiety disorder: Secondary | ICD-10-CM

## 2019-01-13 DIAGNOSIS — F322 Major depressive disorder, single episode, severe without psychotic features: Secondary | ICD-10-CM

## 2019-01-13 DIAGNOSIS — F329 Major depressive disorder, single episode, unspecified: Secondary | ICD-10-CM | POA: Diagnosis not present

## 2019-01-13 NOTE — Progress Notes (Signed)
Virtual Visit via Video Note  I connected with Teresa Liu on 01/13/19 at  9:00 AM EST by a video enabled telemedicine application and verified that I am speaking with the correct person using two identifiers.  Location: Patient: Teresa Liu Provider: Lise Auer, LCSW   I discussed the limitations of evaluation and management by telemedicine and the availability of in person appointments. The patient expressed understanding and agreed to proceed.  History of Present Illness: MDD and GAD  Observations/Objective: Case Manager checked in with all participants to review discharge dates, insurance authorizations, work-related documents and needs for the treatment team. Case manager introduced guest speaker, Jeanella Craze, [place], to facilitate a discussion around Grief and Loss topics. Patient participated in discussion and shared insights about their own needs regarding this topic. Wm discussed losses and added responsibilities due to [place] pandemic.  Counselor allowed time for reflection and journaling on the topic of grief/loss and promoted continuing this work in individual therapy.   Counselor facilitated a brief check in with group members to gage mood and current functioning as well as their takeaways from the presentation. Ninoska shared that she is experiencing physical pain that is impacting her functioning and how she plans to follow up with providers about recommendations. She discussed anticipation of dynamic changes in her home when her adult daughter returns from college for the holidays.  Counselor engaged the group in an ice breaker to highlight positive affirmations as a way for cognitive coping and positive psychology.   Counselor introduced Field seismologist, Jan Fireman, Yoga Instructor, to guide the group in a yoga practice. Counselor checked in with all participants to assess the benefits.   Assessment and Plan: Counselor recommends that patient  remains in IOP treatment to better manage mental health symptoms and continue to address treatment plan goals. Counselor recommends adherence to crisis/safety plan, taking medications as prescribed and following up with medical professionals if any issues arise.   Follow Up Instructions: Counselor will send Webex link for next session.    I discussed the assessment and treatment plan with the patient. The patient was provided an opportunity to ask questions and all were answered. The patient agreed with the plan and demonstrated an understanding of the instructions.   The patient was advised to call back or seek an in-person evaluation if the symptoms worsen or if the condition fails to improve as anticipated.  I provided 180 minutes of non-face-to-face time during this encounter.   Lise Auer, LCSW

## 2019-01-14 ENCOUNTER — Other Ambulatory Visit (HOSPITAL_COMMUNITY): Payer: PRIVATE HEALTH INSURANCE

## 2019-01-14 ENCOUNTER — Other Ambulatory Visit: Payer: Self-pay

## 2019-01-14 ENCOUNTER — Encounter (HOSPITAL_COMMUNITY): Payer: Self-pay

## 2019-01-17 ENCOUNTER — Other Ambulatory Visit (HOSPITAL_COMMUNITY): Payer: PRIVATE HEALTH INSURANCE | Admitting: Psychiatry

## 2019-01-17 ENCOUNTER — Other Ambulatory Visit: Payer: Self-pay

## 2019-01-17 ENCOUNTER — Encounter (HOSPITAL_COMMUNITY): Payer: Self-pay

## 2019-01-17 DIAGNOSIS — F329 Major depressive disorder, single episode, unspecified: Secondary | ICD-10-CM | POA: Diagnosis not present

## 2019-01-17 DIAGNOSIS — F411 Generalized anxiety disorder: Secondary | ICD-10-CM

## 2019-01-17 DIAGNOSIS — F322 Major depressive disorder, single episode, severe without psychotic features: Secondary | ICD-10-CM

## 2019-01-17 NOTE — Progress Notes (Signed)
Virtual Visit via Video Note  I connected with [name] on 01/17/19 at  9:00 AM EST by a video enabled telemedicine application and verified that I am speaking with the correct person using two identifiers.  Location: Patient: Teresa Liu Provider: Lise Auer, LCSW   I discussed the limitations of evaluation and management by telemedicine and the availability of in person appointments. The patient expressed understanding and agreed to proceed.  History of Present Illness: MDD and GAD   Observations/Objective: Case Manager checked in with all participants to review discharge dates, insurance authorizations, work-related documents and needs for the treatment team. Counselor processed current mood and functioning and discussed how participants spent their time since last session and if skills were applied. Edlyn expressed that she has been questioning her role as a mother and would like to see things change within her household. Counselor provided information about holding a family meeting and reviewed communication skills. Arial expressed issues within the workplace as well. Gypsie presents with severe depression and anxiety.    Counselor prompted group members to utilize a vin diagram to compare and contrast their anxiety and depressive symptoms, triggers, responses, etc. Group members shared their reflections. Machel noted that her anxiety causes racing thoughts, worry, irritability, restlessness and her depression causes low self-esteem, lack of motivation, and lack of hope.  Counselor shared a video from [facility] [place] on "Avoidance: How Anxiety and Depression are Related". Group members shared their takeaways and connections. Aneri commented that the video made her realize how disconnected and shutdown she is in all areas of her life. She would like to reflect more on the concepts and how to apply them to her life.   Counselor introduced the concept of cognitive  distortions to the group by sharing a video explaining 15 of the most common cognitive distortions. Group members shared which distortions happen most in their minds. She connected with jumping to conclusions, filtering, focusing on the negative and the fallacy of fairness. Tomorrow we will discuss ways to combat the cognitive distortions.   Counselor ended group by prompting all to share one self-care behavior they can participate in today or one productivity activity they can accomplish. Tatiyanna plans to take time out to self-reflect and meditate. She wants to write her thoughts down to process.   Assessment and Plan: Counselor recommends that patient remains in IOP treatment to better manage mental health symptoms and continue to address treatment plan goals. Counselor recommends adherence to crisis/safety plan, taking medications as prescribed and following up with medical professionals if any issues arise.   Follow Up Instructions: Counselor will send Webex link for next session.    I discussed the assessment and treatment plan with the patient. The patient was provided an opportunity to ask questions and all were answered. The patient agreed with the plan and demonstrated an understanding of the instructions.   The patient was advised to call back or seek an in-person evaluation if the symptoms worsen or if the condition fails to improve as anticipated.  I provided 180 minutes of non-face-to-face time during this encounter.   Lise Auer, LCSW

## 2019-01-18 ENCOUNTER — Encounter (HOSPITAL_COMMUNITY): Payer: Self-pay

## 2019-01-18 ENCOUNTER — Other Ambulatory Visit: Payer: Self-pay

## 2019-01-18 ENCOUNTER — Other Ambulatory Visit (HOSPITAL_COMMUNITY): Payer: PRIVATE HEALTH INSURANCE | Admitting: Psychiatry

## 2019-01-18 DIAGNOSIS — F329 Major depressive disorder, single episode, unspecified: Secondary | ICD-10-CM | POA: Diagnosis not present

## 2019-01-18 DIAGNOSIS — F411 Generalized anxiety disorder: Secondary | ICD-10-CM

## 2019-01-18 DIAGNOSIS — F322 Major depressive disorder, single episode, severe without psychotic features: Secondary | ICD-10-CM

## 2019-01-18 NOTE — Progress Notes (Signed)
Virtual Visit via Video Note  I connected with Teresa Liu on 01/18/19 at  9:00 AM EST by a video enabled telemedicine application and verified that I am speaking with the correct person using two identifiers.  Location: Patient: Teresa Liu Provider: Lise Auer, LCSW   I discussed the limitations of evaluation and management by telemedicine and the availability of in person appointments. The patient expressed understanding and agreed to proceed.  History of Present Illness: MDD and GAD   Observations/Objective: Case Manager checked in with all participants to review discharge dates, insurance authorizations, work-related documents and needs for the treatment team. Counselor processed current mood and functioning and discussed how participants spent their time since last session and if skills were applied. Meredyth shared that she was able to have one on one time with her college aged daughter yesterday. She noted that it was a pleasant interaction and conversation, making her more hopeful about her future. She is working on communicating her and the families needs with her husband to get on a better page emotionally and functionally. Kamisha presents with moderate anxiety and moderate depression.    Counselor reviewed information on Cognitive Distortions and provided worksheets on Cognitive Coping Strategies on combating Cognitive Distortions and Automatic Thoughts. Group members participated in the discussions and activities, and reported back to the group with their written work. Khloe did an effective job of rewriting her automatic thoughts and being solution minded instead of shaming herself.   Counselor engaged the group in creating their individual ECOMAPs highlighting important/current relationships with people and entities, how they experience the relationships and the energy they are putting in or avoiding. Group members took time to create their own then shared their  reflections with the group. TiShuan identified that she would like to have an intentional focus on improving her core family relationships, looking for work stress relief and getting reconnected with church/faith. Each group member identified goals based on the needs they identified in these areas.  Counselor provided psychoeducation on [employer], as a couple group members requested coping strategies on this topic. Counselor ended group by prompting all to share one self-care behavior they can participate in today or one productivity activity they can accomplish. Shirlena plans to attend a doctor's appointment, grocery shopping and resting her mind through mindfulness techniques.   Assessment and Plan: Counselor recommends that patient remains in IOP treatment to better manage mental health symptoms and continue to address treatment plan goals. Counselor recommends adherence to crisis/safety plan, taking medications as prescribed and following up with medical professionals if any issues arise.   Follow Up Instructions: Counselor will send Webex link for next session.    I discussed the assessment and treatment plan with the patient. The patient was provided an opportunity to ask questions and all were answered. The patient agreed with the plan and demonstrated an understanding of the instructions.   The patient was advised to call back or seek an in-person evaluation if the symptoms worsen or if the condition fails to improve as anticipated.  I provided 180 minutes of non-face-to-face time during this encounter.   Lise Auer, LCSW

## 2019-01-19 ENCOUNTER — Encounter (HOSPITAL_COMMUNITY): Payer: Self-pay

## 2019-01-19 ENCOUNTER — Other Ambulatory Visit: Payer: Self-pay

## 2019-01-19 ENCOUNTER — Other Ambulatory Visit (HOSPITAL_COMMUNITY): Payer: PRIVATE HEALTH INSURANCE | Admitting: Psychiatry

## 2019-01-19 DIAGNOSIS — F411 Generalized anxiety disorder: Secondary | ICD-10-CM

## 2019-01-19 DIAGNOSIS — F329 Major depressive disorder, single episode, unspecified: Secondary | ICD-10-CM | POA: Diagnosis not present

## 2019-01-19 DIAGNOSIS — F322 Major depressive disorder, single episode, severe without psychotic features: Secondary | ICD-10-CM

## 2019-01-19 NOTE — Progress Notes (Signed)
Virtual Visit via Video Note  I connected with Teresa Liu on 01/19/19 at  9:00 AM EST by a video enabled telemedicine application and verified that I am speaking with the correct person using two identifiers.  Location: Patient: Teresa Liu Provider: Lise Auer, LCSW   I discussed the limitations of evaluation and management by telemedicine and the availability of in person appointments. The patient expressed understanding and agreed to proceed.  History of Present Illness: MDD and GAD   Observations/Objective: Case Manager checked in with all participants to review discharge dates, insurance authorizations, work-related documents and needs for the treatment team. Counselor processed current mood and functioning and discussed how participants spent their time since last session and if skills were applied. Teresa Liu reported on her experience with high anxiety in the community yesterday. We discussed additional coping strategies to implement in future situations. She chose to spend quality time with her children and sister yesterday. She experienced good rest overnight, but it was interrupted for about an hour and a half. Teresa Liu presents with moderate depression and moderate anxiety.    Counselor engaged group writing and verbalizing gratitude statements. Group members shared their responses. Teresa Liu became teary and emotional discussing her relationship with her father and the loss of her mother. She identified that she would like to work more on her grief and loss in this area. Counselor discussed ways to incorporate gratitude statements in their daily lives to combat negative cognitions.   Counselor provided psychoeducation on Locus of Control, internal, external and the influence factor. Counselor prompted group to complete a worksheet to process through a current problem they are having to apply the skills. Group members gave insight to how they would address their problems  using this technique and new framework of thinking. Teresa Liu processed the situation with her husband and his siblings. She identified strategies to influence and release control of the situation.   Counselor engaged the group in a guided imagery entitled, "Relaxation for Dealing with Failure and Rejection" which incorporated deep breathing, muscle isolation and relaxation, positive affirmations and visualizations. Group members participated fully and shared their experience with the practice. Teresa Liu stated that it was very calming and relaxing.  Counselor assessed to identify how each group member planned to spend the long, holiday weekend, sharing the crisis numbers, identifying coping strategies and self-care behaviors. Teresa Liu plans to rest this weekend and spend quality time with her family.   Assessment and Plan: Counselor recommends that patient remains in IOP treatment to better manage mental health symptoms and continue to address treatment plan goals. Counselor recommends adherence to crisis/safety plan, taking medications as prescribed and following up with medical professionals if any issues arise.   Follow Up Instructions: Counselor will send Webex link for next session.    I discussed the assessment and treatment plan with the patient. The patient was provided an opportunity to ask questions and all were answered. The patient agreed with the plan and demonstrated an understanding of the instructions.   The patient was advised to call back or seek an in-person evaluation if the symptoms worsen or if the condition fails to improve as anticipated.  I provided 180 minutes of non-face-to-face time during this encounter.   Lise Auer, LCSW

## 2019-01-24 ENCOUNTER — Other Ambulatory Visit: Payer: Self-pay

## 2019-01-24 ENCOUNTER — Other Ambulatory Visit (HOSPITAL_COMMUNITY): Payer: PRIVATE HEALTH INSURANCE | Admitting: Psychiatry

## 2019-01-24 DIAGNOSIS — F322 Major depressive disorder, single episode, severe without psychotic features: Secondary | ICD-10-CM

## 2019-01-24 DIAGNOSIS — F329 Major depressive disorder, single episode, unspecified: Secondary | ICD-10-CM | POA: Diagnosis not present

## 2019-01-24 DIAGNOSIS — F411 Generalized anxiety disorder: Secondary | ICD-10-CM

## 2019-01-25 ENCOUNTER — Encounter (HOSPITAL_COMMUNITY): Payer: Self-pay

## 2019-01-25 ENCOUNTER — Other Ambulatory Visit (HOSPITAL_COMMUNITY): Payer: PRIVATE HEALTH INSURANCE | Attending: Psychiatry | Admitting: Psychiatry

## 2019-01-25 ENCOUNTER — Other Ambulatory Visit: Payer: Self-pay

## 2019-01-25 DIAGNOSIS — F329 Major depressive disorder, single episode, unspecified: Secondary | ICD-10-CM | POA: Insufficient documentation

## 2019-01-25 DIAGNOSIS — F411 Generalized anxiety disorder: Secondary | ICD-10-CM | POA: Insufficient documentation

## 2019-01-25 DIAGNOSIS — F322 Major depressive disorder, single episode, severe without psychotic features: Secondary | ICD-10-CM

## 2019-01-25 NOTE — Progress Notes (Signed)
Virtual Visit via Video Note  I connected with Teresa Liu on 01/25/19 at  9:00 AM EST by a video enabled telemedicine application and verified that I am speaking with the correct person using two identifiers.  Location: Patient: Teresa Liu Provider: Lise Auer, LCSW   I discussed the limitations of evaluation and management by telemedicine and the availability of in person appointments. The patient expressed understanding and agreed to proceed.  History of Present Illness: MDD and GAD   Observations/Objective: Case Manager checked in with all participants to review discharge dates, insurance authorizations, work-related documents and needs for the treatment team. Counselor processed current mood and functioning and discussed how participants spent their time since last session and if skills were applied. Teresa Liu discussed her role with assisting her son with virtual learning, allowing herself to rest, the repair in her relationship with her husband and the potential COVID scare within her family. Teresa Liu presents with high anxiety and moderate depression.   Counselor reviewed the Group Members ACE's Scores and prompted them to share about their family life from 8-18 yrs old in relation to adverse childhood experiences. Teresa Liu scored 0/10 on the ACE's study, however she was able to connect with the impact of adversities on others in her life. Counselor shared findings from the ACEs study in relation to health and mental health indicators for higher scores. Counselor facilitated the Resiliency Quiz to determine the protective factors Group Members had in place to buffer the traumatic stress in their lives. Group members shared their scores and their thoughts on resiliency. Teresa Liu scored a 14/14 and stated that she was very loved and supported throughout her childhood and adulthood by family and friends.   Counselor presented information on Adult Children of Alcoholics, reviewing  common issues and patterns of behavior and functioning within the dynamics and how it flows over into adulthood. Group members shared their connections and reflections on the information. Teresa Liu could not relate to having a parent with addiction issues, however, she related to the role of the "lost child" in relation to the dynamic between her and her sister. She discussed the impact of this relationship and how it has negatively impacted her coping skills in processing and expressing emotions with others.   Counselor prompted Group Members to share plans for the afternoon, encouraging self-care or ideas for productivity activities. Teresa Liu plans to monitor her risk for COVID symptoms and attempted to relax and think rationally about the situation.   Assessment and Plan: Counselor recommends that patient remains in IOP treatment to better manage mental health symptoms and continue to address treatment plan goals. Counselor recommends adherence to crisis/safety plan, taking medications as prescribed and following up with medical professionals if any issues arise.   Follow Up Instructions: Counselor will send Webex link for next session.    I discussed the assessment and treatment plan with the patient. The patient was provided an opportunity to ask questions and all were answered. The patient agreed with the plan and demonstrated an understanding of the instructions.   The patient was advised to call back or seek an in-person evaluation if the symptoms worsen or if the condition fails to improve as anticipated.  I provided 180 minutes of non-face-to-face time during this encounter.   Lise Auer, LCSW

## 2019-01-25 NOTE — Progress Notes (Signed)
Virtual Visit via Video Note  I connected with [name] on 01/25/19 at  9:00 AM EST by a video enabled telemedicine application and verified that I am speaking with the correct person using two identifiers.  Location: Patient: Teresa Liu Provider: Lise Auer, LCSW   I discussed the limitations of evaluation and management by telemedicine and the availability of in person appointments. The patient expressed understanding and agreed to proceed.  History of Present Illness: MDD and GAD   Observations/Objective: Case Manager checked in with all participants to review discharge dates, insurance authorizations, work-related documents and needs for the treatment team. Counselor processed current mood and functioning and discussed how participants spent their time since last session and if skills were applied. Braileigh shared about family conflict that was experienced over the holiday. She discussed her increased anxiety and how she dealt with the tension by applying skills learned in group. Jackee asked for feedback from the group on how to approach the issue moving forward. Kashia presents with moderate depression and moderate anxiety.  Counselor engaged the group in a discussion about Trust Issues, prompting the group to reflect on their thoughts, beliefs and values related to psychoeducation. Genesi shared how she has learned to lower expectations of others. She is able to trust certain parts of her life with specific individuals. Overall she would say she is distrusting of others and would like to work on a healthier balance in this area. Counselor provided psychoeducation on [place] vs Mistrust and facilitated a discussion around the various tasks within the developmental cycle. Group members identified which areas they have mastered and where they feel stuck. She connected with feeling stuck in the Identity vs. Role confusion stage.    Counselor introduced the group to the Adverse Childhood Experience (ACEs) Study. Counselor facilitated the survey with group members and all shared their ACE's score. Lakoda scored a /10. Counselor to process findings from the study and implications for group members upon next session.   Counselor acknowledged a graduating group member, celebrating her progress and growth during her time in the program. The graduating group member reflected on what she has gained, learned, applied and received through the program, as well as plans to continue in treatment. Group members shared their observations of progress and words of encouragement.   Assessment and Plan: Counselor recommends that patient remains in IOP treatment to better manage mental health symptoms and continue to address treatment plan goals. Counselor recommends adherence to crisis/safety plan, taking medications as prescribed and following up with medical professionals if any issues arise.   Follow Up Instructions: Counselor will send Webex link for next session.    I discussed the assessment and treatment plan with the patient. The patient was provided an opportunity to ask questions and all were answered. The patient agreed with the plan and demonstrated an understanding of the instructions.   The patient was advised to call back or seek an in-person evaluation if the symptoms worsen or if the condition fails to improve as anticipated.  I provided 180 minutes of non-face-to-face time during this encounter.   Lise Auer, LCSW

## 2019-01-26 ENCOUNTER — Other Ambulatory Visit: Payer: Self-pay

## 2019-01-26 ENCOUNTER — Other Ambulatory Visit (HOSPITAL_COMMUNITY): Payer: PRIVATE HEALTH INSURANCE | Admitting: Family

## 2019-01-26 ENCOUNTER — Encounter (HOSPITAL_COMMUNITY): Payer: Self-pay | Admitting: Family

## 2019-01-26 DIAGNOSIS — F329 Major depressive disorder, single episode, unspecified: Secondary | ICD-10-CM | POA: Diagnosis not present

## 2019-01-26 DIAGNOSIS — F322 Major depressive disorder, single episode, severe without psychotic features: Secondary | ICD-10-CM

## 2019-01-26 DIAGNOSIS — F411 Generalized anxiety disorder: Secondary | ICD-10-CM

## 2019-01-26 NOTE — Progress Notes (Signed)
Virtual Visit via Video Note  I connected with Teresa Liu on 01/26/19 at  9:00 AM EST by a video enabled telemedicine application and verified that I am speaking with the correct person using two identifiers.  Location: Patient: Teresa Liu Provider: Lise Auer, LCSW   I discussed the limitations of evaluation and management by telemedicine and the availability of in person appointments. The patient expressed understanding and agreed to proceed.  History of Present Illness: MDD and GAD   Observations/Objective: Case Manager checked in with all participants to review discharge dates, insurance authorizations, work-related documents and needs for the treatment team.   Counselor introduced our guest speaker, Einar Grad, [facility] Pharmacist, who shared about psychiatric medications, side effects, treatment considerations and how to communicate with medical professionals. Group Members asked questions and shared medication concerns. Counselor prompted group members to reference a worksheet called, "Body Scan" to jot down questions and concerns about their physical health in preparation for their upcoming appointments with medical professionals. Jaquelyn discussed a variety of medical issues that she is in the process of receiving treatment and recommendations. She expressed emotional impact of her diagnosis. She understands a needs to focus on self-care and follow up to address current concerns. Counselor and guest PA Student encouraged routine medical check-ups, preparing for appointments, following up with recommendations and seeking specialist if needed.   Counselor processed current mood and functioning and discussed how participants spent their time since last session and if skills were applied. Coletta shared that she utilized prayer, phone gaming and watching game shows on television to cope with mental health stressors. Akeila presents with moderate anxiety and mild depression.    Counselor provided strategies for Stress Management in the Workplace through two videos created by a MD and a Dr of Psychology. Group Members identified which strategies they plan to implement to achieve better work-life balance and in prevention of mental health breakdowns due to work-related stressors. Iysis would like to implement the practice of deep breathing, self-care, taking advantage of breaks and expanding her options on the workforce.   Group members shared their plans for the afternoon/evening, incorporating self-care and productivity activities to alleviate stress/anxiety. Bitania would like to start decorating from the holidays with her family today. She discussed her hopes of her family engaging and enjoying the process and time together.   Assessment and Plan: Counselor recommends that patient remains in IOP treatment to better manage mental health symptoms and continue to address treatment plan goals. Counselor recommends adherence to crisis/safety plan, taking medications as prescribed and following up with medical professionals if any issues arise.   Follow Up Instructions: Counselor will send Webex link for next session.    I discussed the assessment and treatment plan with the patient. The patient was provided an opportunity to ask questions and all were answered. The patient agreed with the plan and demonstrated an understanding of the instructions.   The patient was advised to call back or seek an in-person evaluation if the symptoms worsen or if the condition fails to improve as anticipated.  I provided 180 minutes of non-face-to-face time during this encounter.   Lise Auer, LCSW

## 2019-01-26 NOTE — Progress Notes (Signed)
  Virtual Visit via Telephone Note  I connected with [name] on 01/26/19 at  9:00 AM EST by telephone and verified that I am speaking with the correct person using two identifiers.   I discussed the limitations, risks, security and privacy concerns of performing an evaluation and management service by telephone and the availability of in person appointments. I also discussed with the patient that there may be a patient responsible charge related to this service. The patient expressed understanding and agreed to proceed.  I discussed the assessment and treatment plan with the patient. The patient was provided an opportunity to ask questions and all were answered. The patient agreed with the plan and demonstrated an understanding of the instructions.   The patient was advised to call back or seek an in-person evaluation if the symptoms worsen or if the condition fails to improve as anticipated.  I provided 15 minutes of non-face-to-face time during this encounter.   Derrill Center, NP   [facility] Intensive Outpatient Program Discharge Summary  Shontay Spadea [id number]  Admission date:  Discharge date: 01/27/2019  Reason for admission: per admission assessment note-Laurynn Reinertsen 43 year old African-American female presents with worsening depression and anxiety.  Patient reports multiple stressors related to home work life.  She reports she has been employed by [employer] for the past 20 years and due to Covid has experienced a change in the working environment which has become overwhelming for her.  Reported grief and loss related to the passing of her mother.  States strained relationship between she and her oldest daughter.  Patient to start intensive outpatient programming on 01/11/2019  Progress in Program Toward Treatment Goals: Ongoing, patient attended and participated with daily group session with active and engaged participation.  Patient stated the program  was helpful and learning additional tools to help with her anxiety and depression.  Reported overall her mood has improved.  Rating her depression 3 out of 10 with 10 being the worst.  She is denying suicidal or homicidal ideations.  Denies auditory or visual hallucinations.  Progress (rationale):  Keep follow-up appointment with Dr Rose Fillers 01/27/2019 and Kristine Linea [facility] 01/31/2019  Take all medications as prescribed. Keep all follow-up appointments as scheduled.  Do not consume alcohol or use illegal drugs while on prescription medications. Report any adverse effects from your medications to your primary care provider promptly.  In the event of recurrent symptoms or worsening symptoms, call 911, a crisis hotline, or go to the nearest emergency department for evaluation.   Derrill Center, NP 01/26/2019

## 2019-01-27 ENCOUNTER — Other Ambulatory Visit: Payer: Self-pay

## 2019-01-27 ENCOUNTER — Other Ambulatory Visit (HOSPITAL_COMMUNITY): Payer: PRIVATE HEALTH INSURANCE | Admitting: Psychiatry

## 2019-01-27 ENCOUNTER — Encounter (HOSPITAL_COMMUNITY): Payer: Self-pay

## 2019-01-27 DIAGNOSIS — F329 Major depressive disorder, single episode, unspecified: Secondary | ICD-10-CM | POA: Diagnosis not present

## 2019-01-27 DIAGNOSIS — F411 Generalized anxiety disorder: Secondary | ICD-10-CM

## 2019-01-27 DIAGNOSIS — F322 Major depressive disorder, single episode, severe without psychotic features: Secondary | ICD-10-CM

## 2019-01-27 NOTE — Progress Notes (Signed)
Virtual Visit via Video Note  I connected with [name] on 01/27/19 at  9:00 AM EST by a video enabled telemedicine application and verified that I am speaking with the correct person using two identifiers.  Location: Patient: Teresa Liu Provider: Lise Auer, LCSW   I discussed the limitations of evaluation and management by telemedicine and the availability of in person appointments. The patient expressed understanding and agreed to proceed.  History of Present Illness: MDD and GAD  Observations/Objective: Case Manager checked in with all participants to review discharge dates, insurance authorizations, work-related documents and needs for the treatment team. Case manager introduced guest speaker, Jeanella Craze, [place], to facilitate a discussion around Grief and Loss topics. Patient participated in discussion and shared insights about their own needs regarding this topic. Merdis discussed work related and familial related losses and how she has unhealthily coped in the past. Counselor allowed time for reflection and journaling on the topic of grief/loss and promoted continuing this work in individual therapy.   Counselor facilitated a brief check in with group members to gage mood and current functioning as well as their takeaways from the presentation. [employer] graduates from the program today. She was able to identify notable progress in implementation of self-care, assertive communication skills, healthy expression of emotions and how to better advocate for herself in the workplace. She intends to continue in treatement through medication management and individual therapy. Londyn presents with moderate depression and mild anxiety.  Counselor engaged the group in an ice breaker to highlight positive affirmations as a way for cognitive coping and positive psychology.   Counselor introduced Field seismologist, Jan Fireman, Yoga Instructor, to guide the group in a yoga  practice. Counselor checked in with all participants to assess the benefits. Shiza participated to her best ability and enjoyed the mindfulness aspects of the practice.   Assessment and Plan: Counselor recommends that patient steps down from IOP treatment to individual therapy and medication management to continue to address treatment plan goals and manage mental health symptoms. Counselor recommends adherence to crisis/safety plan, taking medications as prescribed and following up with medical professionals if any issues arise.   Follow Up Instructions: Counselor will send Webex link for next session.    I discussed the assessment and treatment plan with the patient. The patient was provided an opportunity to ask questions and all were answered. The patient agreed with the plan and demonstrated an understanding of the instructions.   The patient was advised to call back or seek an in-person evaluation if the symptoms worsen or if the condition fails to improve as anticipated.  I provided 180 minutes of non-face-to-face time during this encounter.   Lise Auer, LCSW

## 2019-01-27 NOTE — Patient Instructions (Signed)
D:  Patient completed MH-IOP today.  A:  Discharge today.  Follow up with Dr. Chucky May and Jan Fireman, [facility] on 01-31-19 @ 12:30 pm.  Encouraged support groups.  Scheduled to return to work on 12-14 or 02-08-19 per Dr. Toy Care.  R:  Patient receptive.

## 2019-01-27 NOTE — Progress Notes (Signed)
Virtual Visit via Video Note  I connected with Teresa Liu on 01/27/19 at 0830 by a video enabled telemedicine application and verified that I am speaking with the correct person using two identifiers.  I discussed the limitations of evaluation and management by telemedicine and the availability of in person appointments. The patient expressed understanding and agreed to proceed. I discussed the assessment and treatment plan with the patient. The patient was provided an opportunity to ask questions and all were answered. The patient agreed with the plan and demonstrated an understanding of the instructions.  The patient was advised to call back or seek an in-person evaluation if the symptoms worsen or if the condition fails to improve as anticipated.  I provided 20 minutes of non-face-to-face time during this encounter.  As per previous CCA states:  This is a 43 yr old, married,employed, [name], who was referred per Dr. Chucky May; treatment for worsening anxiety and depressive symptoms.  States she's been seeing Dr. Toy Care since Feb. 2020; with a hx of seeing a therapist.  Multiple Stressors:  1) Job ([employer]) of 20 yrs.  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  "There's been a lot of changes.  They want [place] to sell all the time."  Pt reports conflict with her mgr.  "He doesn't care."  States she feels stuck at the job because of the good benefits and pay.  Dr. Toy Care pulled pt out of work in August 2020.  2)  Unresolved grief/loss issues:  Mother passed in 2007.  "She was my rock."  3) 76 yr old son in virtual school at home.  4)  Conflictual relationship with 71 yr old daughter.  "I feel we missed out on time together because of her chaotic high school years."  According to pt, daughter was very disrespectful during that time.  She had ran away at one time.  She is currently residing on campus at [employer] [facility] [employer].  "I wish we had a better relationship."  Pt denies any inpatient hospitalizations.  Denies  hx of suicide attempts or gestures. Patients Currently Reported Symptoms/Problems: increased anxiety, poor sleep, ruminating thoughts, poor concentration, tearful, poor energy, anhedonia, fatigue, sadness, poor self-esteem   Pt attended all scheduled days except for one in [place].  States although group wasn't her comfort zone, she was able to complete it.  Reports overall feeling "ok." Denies SI/HI or A/V hallucinations.  Pt continues to struggle with anxiety re: job.  Pt has been applying her coping skills. A:  D/C today.  F/U with Dr. Toy Care and Jan Fireman, [facility] on 01-31-19 @ 12:30 pm.  Encouraged support groups.  Pt will return to work on 02-07-19 or 02-08-19 per Dr. Chucky May.  R: Patient receptive.   Dellia Nims, M.Ed,CNA

## 2019-01-28 ENCOUNTER — Ambulatory Visit (HOSPITAL_COMMUNITY): Payer: PRIVATE HEALTH INSURANCE

## 2019-01-31 ENCOUNTER — Other Ambulatory Visit: Payer: Self-pay

## 2019-01-31 ENCOUNTER — Ambulatory Visit (HOSPITAL_COMMUNITY): Payer: PRIVATE HEALTH INSURANCE | Admitting: Psychology

## 2019-02-21 NOTE — Progress Notes (Signed)
Cardiology Office Note:    Date:  02/22/2019   ID:  Teresa Liu, DOB 1976/01/23, MRN [id number]  PCP:  Kelton Pillar, MD  Cardiologist:  Mertie Moores, MD  Electrophysiologist:  None   Referring MD: Kelton Pillar, MD   Chief Complaint  Patient presents with  . Follow-up    HTN    History of Present Illness:    Teresa Liu is a 43 y.o. female with:   Hypertension   Diastolic dysfunction by echocardiogram 2017  Sinus tachycardia  Meningioma s/p resection in 2012  Teresa Liu was last seen in 03/2017.  She returns for follow-up.  She was out of her metoprolol for a few weeks and had some palpitations associated with this.  She found some and resumed this with relief in her symptoms.  However, she is run out and has not taken any in the last few days.  She has not had significant chest discomfort, shortness of breath or syncope.  She did develop psoriasis and wonders if metoprolol may be a cause.  I am not aware of beta-blockers contributing to psoriasis.  Prior CV studies:   The following studies were reviewed today:   Echo 03/23/15 EF [id number], grade 2 diastolic dysfunction  Event monitor 03/23/15 Sinus rhythm No arrhythmias noted Symptoms of palpitations did not correlate with arrhythmia.   Past Medical History:  Diagnosis Date  . Anxiety   . Depression   . HTN (hypertension) 08/20/2015  . Meningioma ([place])   . Migraines    Surgical Hx: The patient  has a past surgical history that includes Craniotomy for tumor (08/26/2010).   Current Medications: Current Meds  Medication Sig  . betamethasone dipropionate 0.05 % cream APPLY ON THE SKIN TWICE DAILY.TAPER USE AS ABLE  . fluocinonide ointment (LIDEX) 0.05 % APPLY DAILY IN THICK SKIN AREAS AND SPARINGLY IN THIN SKIN AREAS AS DIRECTED  . folic acid (FOLVITE) 1 MG tablet TAKE 1 TABLET BY MOUTH EVERY DAY  . ibuprofen (ADVIL) 600 MG tablet Take 600 mg by mouth every 6 (six) hours as needed.  .  methotrexate (RHEUMATREX) 2.5 MG tablet TAKE 8 TABLETS BY MOUTH WEEKLY  . metoprolol succinate (TOPROL-XL) 100 MG 24 hr tablet Take 1 tablet (100 mg total) by mouth daily.  . mometasone (ELOCON) 0.1 % lotion Apply 3 drops in affected ear(s) twice each week. .  Ardyth Harps 0.12-0.015 MG/24HR vaginal ring Place 1 each vaginally every 28 (twenty-eight) days.  Marland Kitchen OLANZapine-FLUoxetine (SYMBYAX) 6-25 MG capsule TAKE 1 CAPSULE BY MOUTH EVERYDAY AT BEDTIME  . [DISCONTINUED] metoprolol succinate (TOPROL-XL) 100 MG 24 hr tablet Take 1 tablet (100 mg total) by mouth daily. Patient needs to call and schedule an appointment for further refills 3rd/final attempt     Allergies:   Patient has no known allergies.   Social History   Tobacco Use  . Smoking status: Never Smoker  . Smokeless tobacco: Never Used  Substance Use Topics  . Alcohol use: No  . Drug use: No     Family Hx: The patient's family history includes Cancer in her mother; Colon cancer in her paternal grandmother; Diabetes in her maternal aunt, maternal uncle, paternal aunt, and paternal uncle; Glaucoma in her maternal grandmother; Hypertension in her father. There is no history of Heart attack.  ROS:   Please see the history of present illness.    ROS All other systems reviewed and are negative.   EKGs/Labs/Other Test Reviewed:    EKG:  EKG is  ordered today.  The ekg ordered today demonstrates normal sinus rhythm, heart rate 94, normal axis, nonspecific ST-T wave changes, QTC 442  Recent Labs: No results found for requested labs within last 8760 hours.   Recent Lipid Panel No results found for: CHOL, TRIG, HDL, CHOLHDL, LDLCALC, LDLDIRECT  Physical Exam:    VS:  BP (!) 148/90   Pulse 94   Ht 5\' 4"  (1.626 m)   Wt 215 lb 3.2 oz (97.6 kg)   BMI 36.94 kg/m     Wt Readings from Last 3 Encounters:  02/22/19 215 lb 3.2 oz (97.6 kg)  08/09/18 208 lb (94.3 kg)  08/05/17 199 lb (90.3 kg)     Physical Exam  Constitutional:  She is oriented to person, place, and time. She appears well-developed and well-nourished. No distress.  HENT:  Head: Normocephalic and atraumatic.  Neck: No JVD present.  Cardiovascular: Normal rate, regular rhythm, S1 normal, S2 normal and normal heart sounds.  No murmur heard. Pulmonary/Chest: Effort normal. She has no rales.  Abdominal: Soft. There is no hepatomegaly.  Musculoskeletal:        General: No edema.     Cervical back: Neck supple.  Neurological: She is alert and oriented to person, place, and time.  Skin: Skin is warm and dry.    ASSESSMENT & PLAN:    1. Essential hypertension BP uncontrolled.  HR is higher today as well.  But, she has been out of Toprol for a few days.  Restart Toprol XL 100 mg once daily.  We will try to get her a BP cuff to monitor her BP.  Goal is < 130/80.  I am not aware of beta-blockers causing psoriasis.  If her dermatologist feels strongly that the drug is indicated, we could try to transition to a calcium channel blocker like Diltiazem.  FU in 1 year or sooner if BP is uncontrolled.    Dispo:  Return in about 1 year (around 02/22/2020) for Routine Follow Up, w/ Dr. Acie Fredrickson, or Richardson Dopp, PA-C, in person.   Medication Adjustments/Labs and Tests Ordered: Current medicines are reviewed at length with the patient today.  Concerns regarding medicines are outlined above.  Tests Ordered: Orders Placed This Encounter  Procedures  . EKG 12/Charge capture   Medication Changes: Meds ordered this encounter  Medications  . metoprolol succinate (TOPROL-XL) 100 MG 24 hr tablet    Sig: Take 1 tablet (100 mg total) by mouth daily.    Dispense:  90 tablet    Refill:  3    Signed, Richardson Dopp, PA-C  02/22/2019 11:26 AM    [facility] [address], [place], [place]  [address] Phone: [phone]; Fax: [phone]

## 2019-02-22 ENCOUNTER — Other Ambulatory Visit: Payer: Self-pay

## 2019-02-22 ENCOUNTER — Ambulatory Visit (INDEPENDENT_AMBULATORY_CARE_PROVIDER_SITE_OTHER): Payer: 59 | Admitting: Physician Assistant

## 2019-02-22 ENCOUNTER — Encounter: Payer: Self-pay | Admitting: Physician Assistant

## 2019-02-22 VITALS — BP 148/90 | HR 94 | Ht 64.0 in | Wt 215.2 lb

## 2019-02-22 DIAGNOSIS — I10 Essential (primary) hypertension: Secondary | ICD-10-CM

## 2019-02-22 MED ORDER — METOPROLOL SUCCINATE ER 100 MG PO TB24: 100.0000 mg | ORAL_TABLET | Freq: Every day | ORAL | 3 refills | Status: DC

## 2019-02-22 NOTE — Patient Instructions (Addendum)
Medication Instructions:  Your physician recommends that you continue on your current medications as directed. Please refer to the Current Medication list given to you today. We have sent in a refill for the Metoprolol for 1 year to [facility]   *If you need a refill on your cardiac medications before your next appointment, please call your pharmacy*  Lab Work: None ordered  If you have labs (blood work) drawn today and your tests are completely normal, you will receive your results only by: Marland Kitchen MyChart Message (if you have MyChart) OR . A paper copy in the mail If you have any lab test that is abnormal or we need to change your treatment, we will call you to review the results.  Testing/Procedures: None ordered  Follow-Up: At [facility], you and your health needs are our priority.  As part of our continuing mission to provide you with exceptional heart care, we have created designated Provider Care Teams.  These Care Teams include your primary Cardiologist (physician) and Advanced Practice Providers (APPs -  Physician Assistants and Nurse Practitioners) who all work together to provide you with the care you need, when you need it.  Your next appointment:   12 month(s)  The format for your next appointment:   In Person  Provider:   You may see Mertie Moores, MD or one of the following Advanced Practice Providers on your designated Care Team:    Richardson Dopp, PA-C  [place], [place]  Daune Perch, NP   Other Instructions  Monitor your blood pressure.  Call if you get readings, consistently, over 130/80

## 2019-07-01 ENCOUNTER — Other Ambulatory Visit: Payer: Self-pay | Admitting: Radiation Therapy

## 2019-07-01 ENCOUNTER — Other Ambulatory Visit: Payer: Self-pay | Admitting: Neurological Surgery

## 2019-07-01 DIAGNOSIS — D329 Benign neoplasm of meninges, unspecified: Secondary | ICD-10-CM

## 2019-07-27 ENCOUNTER — Other Ambulatory Visit: Payer: Self-pay | Admitting: Neurological Surgery

## 2019-07-30 ENCOUNTER — Ambulatory Visit
Admission: RE | Admit: 2019-07-30 | Discharge: 2019-07-30 | Disposition: A | Discharge status: Home / Self Care | Payer: 59 | Source: Ambulatory Visit | Attending: Neurological Surgery | Admitting: Neurological Surgery

## 2019-07-30 DIAGNOSIS — D329 Benign neoplasm of meninges, unspecified: Secondary | ICD-10-CM

## 2019-07-30 MED ORDER — GADOBENATE DIMEGLUMINE 529 MG/ML IV SOLN
20.0000 mL | Freq: Once | INTRAVENOUS | Status: AC | PRN
  Administered 2019-07-30: 20 mL via INTRAVENOUS

## 2019-08-10 ENCOUNTER — Other Ambulatory Visit: Payer: Self-pay

## 2019-08-10 ENCOUNTER — Encounter: Payer: Self-pay | Admitting: Obstetrics & Gynecology

## 2019-08-10 ENCOUNTER — Ambulatory Visit (INDEPENDENT_AMBULATORY_CARE_PROVIDER_SITE_OTHER): Payer: BC Managed Care – PPO | Admitting: Obstetrics & Gynecology

## 2019-08-10 VITALS — BP 132/84 | Ht 64.0 in | Wt 220.0 lb

## 2019-08-10 DIAGNOSIS — Z01419 Encounter for gynecological examination (general) (routine) without abnormal findings: Secondary | ICD-10-CM | POA: Diagnosis not present

## 2019-08-10 DIAGNOSIS — Z6837 Body mass index (BMI) 37.0-37.9, adult: Secondary | ICD-10-CM | POA: Diagnosis not present

## 2019-08-10 DIAGNOSIS — Z3044 Encounter for surveillance of vaginal ring hormonal contraceptive device: Secondary | ICD-10-CM | POA: Diagnosis not present

## 2019-08-10 DIAGNOSIS — E6609 Other obesity due to excess calories: Secondary | ICD-10-CM

## 2019-08-10 MED ORDER — NUVARING 0.12-0.015 MG/24HR VA RING: 1.0000 | VAGINAL_RING | VAGINAL | 4 refills | Status: DC

## 2019-08-10 NOTE — Progress Notes (Signed)
Teresa Liu 1975/03/10 [id number]   History:    44 y.o. G2P2L2 Married.  Works at [employer].  Daughter rising Sophomore at [employer].  Son in 4th grade.  RP:  Established patient presenting for annual gyn exam   HPI: Well on Nuvaring.  No BTB.  No pelvic pain.  No pain with IC.  Urine/BMs normal.  Breasts normal.  BMI 37.76.  Needs to increase physical activity.  Health labs with Fam MD.   Past medical history,surgical history, family history and social history were all reviewed and documented in the EPIC chart.  Gynecologic History Patient's last menstrual period was 08/04/2019.  Obstetric History OB History  Gravida Para Term Preterm AB Living  2 2       2   SAB TAB Ectopic Multiple Live Births               # Outcome Date GA Lbr Len/2nd Weight Sex Delivery Anes PTL Lv  2 Para           1 Para              ROS: A ROS was performed and pertinent positives and negatives are included in the history.  GENERAL: No fevers or chills. HEENT: No change in vision, no earache, sore throat or sinus congestion. NECK: No pain or stiffness. CARDIOVASCULAR: No chest pain or pressure. No palpitations. PULMONARY: No shortness of breath, cough or wheeze. GASTROINTESTINAL: No abdominal pain, nausea, vomiting or diarrhea, melena or bright red blood per rectum. GENITOURINARY: No urinary frequency, urgency, hesitancy or dysuria. MUSCULOSKELETAL: No joint or muscle pain, no back pain, no recent trauma. DERMATOLOGIC: No rash, no itching, no lesions. ENDOCRINE: No polyuria, polydipsia, no heat or cold intolerance. No recent change in weight. HEMATOLOGICAL: No anemia or easy bruising or bleeding. NEUROLOGIC: No headache, seizures, numbness, tingling or weakness. PSYCHIATRIC: No depression, no loss of interest in normal activity or change in sleep pattern.     Exam:   BP 132/84   Ht 5\' 4"  (1.626 m)   Wt 220 lb (99.8 kg)   LMP 08/04/2019 Comment: NUVARING  BMI 37.76 kg/m   Body mass index is 37.76  kg/m.  General appearance : Well developed well nourished female. No acute distress HEENT: Eyes: no retinal hemorrhage or exudates,  Neck supple, trachea midline, no carotid bruits, no thyroidmegaly Lungs: Clear to auscultation, no rhonchi or wheezes, or rib retractions  Heart: Regular rate and rhythm, no murmurs or gallops Breast:Examined in sitting and supine position were symmetrical in appearance, no palpable masses or tenderness,  no skin retraction, no nipple inversion, no nipple discharge, no skin discoloration, no axillary or supraclavicular lymphadenopathy Abdomen: no palpable masses or tenderness, no rebound or guarding Extremities: no edema or skin discoloration or tenderness  Pelvic: Vulva: Normal             Vagina: No gross lesions or discharge  Cervix: No gross lesions or discharge  Uterus  AV, normal size, shape and consistency, non-tender and mobile  Adnexa  Without masses or tenderness  Anus: Normal   Assessment/Plan:  44 y.o. female for annual exam   1. Well female exam with routine gynecological exam Normal gynecologic exam.  Pap test June 2020 was negative, no indication to repeat this year.  Breast exam normal.  Screening mammogram July 2020 was negative.  Health labs with family physician.  2. Encounter for surveillance of vaginal ring hormonal contraceptive device Well on NuvaRing.  No contraindication to continue.  Prescription sent to pharmacy.  3. Class 2 obesity due to excess calories without serious comorbidity with body mass index (BMI) of 37.0 to 37.9 in adult Recommend a lower calorie/carb diet such as [employer].  Intermittent fasting reviewed.  Aerobic activities 5 times a week and light weightlifting every 2 days.  Other orders - NUVARING 0.12-0.015 MG/24HR vaginal ring; Place 1 each vaginally every 28 (twenty-eight) days.  Princess Bruins MD, 3:16 PM 08/10/2019

## 2019-08-10 NOTE — Patient Instructions (Signed)
1. Well female exam with routine gynecological exam Normal gynecologic exam.  Pap test June 2020 was negative, no indication to repeat this year.  Breast exam normal.  Screening mammogram July 2020 was negative.  Health labs with family physician.  2. Encounter for surveillance of vaginal ring hormonal contraceptive device Well on NuvaRing.  No contraindication to continue.  Prescription sent to pharmacy.  3. Class 2 obesity due to excess calories without serious comorbidity with body mass index (BMI) of 37.0 to 37.9 in adult Recommend a lower calorie/carb diet such as [employer].  Intermittent fasting reviewed.  Aerobic activities 5 times a week and light weightlifting every 2 days.  Other orders - NUVARING 0.12-0.015 MG/24HR vaginal ring; Place 1 each vaginally every 28 (twenty-eight) days.  Teresa Liu, it was a pleasure seeing you today!

## 2019-08-26 ENCOUNTER — Telehealth: Payer: Self-pay | Admitting: *Deleted

## 2019-08-26 MED ORDER — ETONOGESTREL-ETHINYL ESTRADIOL 0.12-0.015 MG/24HR VA RING: 1.0000 | VAGINAL_RING | VAGINAL | 4 refills | Status: DC

## 2019-08-26 NOTE — Telephone Encounter (Signed)
Patient called stating brand nuvaring was sent to pharmacy, asked if generic could be sent due to cost. Rx sent.

## 2019-09-06 ENCOUNTER — Other Ambulatory Visit: Payer: Self-pay | Admitting: Obstetrics & Gynecology

## 2019-09-06 DIAGNOSIS — Z1231 Encounter for screening mammogram for malignant neoplasm of breast: Secondary | ICD-10-CM

## 2019-09-28 ENCOUNTER — Ambulatory Visit: Payer: BC Managed Care – PPO

## 2019-10-07 ENCOUNTER — Other Ambulatory Visit: Payer: Self-pay

## 2019-10-07 ENCOUNTER — Ambulatory Visit
Admission: RE | Admit: 2019-10-07 | Discharge: 2019-10-07 | Disposition: A | Discharge status: Home / Self Care | Payer: BC Managed Care – PPO | Source: Ambulatory Visit | Attending: Obstetrics & Gynecology | Admitting: Obstetrics & Gynecology

## 2019-10-07 DIAGNOSIS — Z1231 Encounter for screening mammogram for malignant neoplasm of breast: Secondary | ICD-10-CM

## 2019-10-26 NOTE — Progress Notes (Unsigned)
no

## 2020-01-30 ENCOUNTER — Telehealth: Payer: Self-pay | Admitting: Physician Assistant

## 2020-01-30 NOTE — Telephone Encounter (Signed)
New Message:      Pt wants to know what was was diagnosis in 2017 when she started taking Metoprolol?

## 2020-01-30 NOTE — Telephone Encounter (Signed)
I reviewed Dr. Elmarie Shiley note from 3/17.  It looks like he started metoprolol for tachycardia and palpitations. Richardson Dopp, PA-C    01/30/2020 5:19 PM

## 2020-01-31 NOTE — Telephone Encounter (Signed)
I called and left patient a detailed message advising her per Dr. Elmarie Shiley office note in 04/2015 she was started on Metoprolol for tachycardia and palpitations. Advised patient to call [phone] with any questions. Ok per patient DPR to leave detailed message.

## 2020-02-29 ENCOUNTER — Other Ambulatory Visit: Payer: Self-pay

## 2020-02-29 ENCOUNTER — Ambulatory Visit (INDEPENDENT_AMBULATORY_CARE_PROVIDER_SITE_OTHER): Payer: BC Managed Care – PPO | Admitting: Physician Assistant

## 2020-02-29 ENCOUNTER — Encounter: Payer: Self-pay | Admitting: Physician Assistant

## 2020-02-29 VITALS — BP 126/88 | HR 73 | Ht 64.0 in | Wt 223.0 lb

## 2020-02-29 DIAGNOSIS — R002 Palpitations: Secondary | ICD-10-CM | POA: Diagnosis not present

## 2020-02-29 DIAGNOSIS — I10 Essential (primary) hypertension: Secondary | ICD-10-CM | POA: Diagnosis not present

## 2020-02-29 NOTE — Patient Instructions (Signed)
Medication Instructions:  Your physician recommends that you continue on your current medications as directed. Please refer to the Current Medication list given to you today.  *If you need a refill on your cardiac medications before your next appointment, please call your pharmacy*  Lab Work: None ordered today  Testing/Procedures: None ordered today  Follow-Up: At [facility], you and your health needs are our priority.  As part of our continuing mission to provide you with exceptional heart care, we have created designated Provider Care Teams.  These Care Teams include your primary Cardiologist (physician) and Advanced Practice Providers (APPs -  Physician Assistants and Nurse Practitioners) who all work together to provide you with the care you need, when you need it.  Your next appointment:   12 month(s)  The format for your next appointment:   In Person  Provider:   You may see Philip Nahser, MD or Scott Weaver, PA-C 

## 2020-02-29 NOTE — Progress Notes (Signed)
Cardiology Office Note:    Date:  02/29/2020   ID:  Teresa Liu, DOB 1975/05/19, MRN [id number]  PCP:  Teresa Small, MD  [facility] Cardiologist:  Teresa Miss, MD   [facility] Electrophysiologist:  None   Referring MD: Teresa Small, MD   Chief Complaint:  Follow-up (Hypertension)    Patient Profile:    Teresa Liu is a 45 y.o. female with:   Hypertension   Diastolic dysfunction by echocardiogram 2017  Sinus tachycardia  Meningioma s/p resection in 2012  Psoriasis   Prior CV studies: Echo 03/23/15 EF 60-65, grade 2 diastolic dysfunction  Event monitor 03/23/15 Sinus rhythm No arrhythmias noted Symptoms of palpitations did not correlate with arrhythmia.  History of Present Illness:    Teresa Liu was last seen in 01/2019.  She returns for annual f/u. She is here alone. Overall, she has been doing well. She still does note some palpitations at night when she lays down in bed. She has not had syncope or near syncope. She has not had chest pain, significant shortness of breath, orthopnea, leg edema. She is on methotrexate for psoriasis. She had recent labs with her dermatologist. She tells me her renal function, blood count and thyroid were all checked and were normal.     Past Medical History:  Diagnosis Date  . Anxiety   . Depression   . HTN (hypertension) 08/20/2015  . Meningioma (HCC)   . Migraines     Current Medications: Current Meds  Medication Sig  . betamethasone dipropionate 0.05 % cream APPLY ON THE SKIN TWICE DAILY.TAPER USE AS ABLE  . etonogestrel-ethinyl estradiol (NUVARING) 0.12-0.015 MG/24HR vaginal ring Place 1 each vaginally every 28 (twenty-eight) days.  . fluocinonide ointment (LIDEX) 0.05 % APPLY DAILY IN THICK SKIN AREAS AND SPARINGLY IN THIN SKIN AREAS AS DIRECTED  . folic acid (FOLVITE) 1 MG tablet TAKE 1 TABLET BY MOUTH EVERY DAY  . ibuprofen (ADVIL) 600 MG tablet Take 600 mg by mouth every 6 (six) hours as  needed.  Marland Kitchen levothyroxine (SYNTHROID) 50 MCG tablet Take 50 mcg by mouth daily.  . methotrexate (RHEUMATREX) 2.5 MG tablet Take 2.5 mg by mouth once a week. Take 6 tablets by mouth weekly  . metoprolol succinate (TOPROL-XL) 100 MG 24 hr tablet Take 1 tablet (100 mg total) by mouth daily.  . mometasone (ELOCON) 0.1 % lotion Apply 3 drops in affected ear(s) twice each week. .  . OLANZapine-FLUoxetine (SYMBYAX) 6-25 MG capsule TAKE 1 CAPSULE BY MOUTH EVERYDAY AT BEDTIME     Allergies:   Patient has no known allergies.   Social History   Tobacco Use  . Smoking status: Never Smoker  . Smokeless tobacco: Never Used  Vaping Use  . Vaping Use: Never used  Substance Use Topics  . Alcohol use: No  . Drug use: No     Family Hx: The patient's family history includes Cancer in her mother; Colon cancer in her paternal grandmother; Diabetes in her maternal aunt, maternal uncle, paternal aunt, and paternal uncle; Glaucoma in her maternal grandmother; Hypertension in her father. There is no history of Heart attack.  ROS   EKGs/Labs/Other Test Reviewed:    EKG:  EKG is   ordered today.  The ekg ordered today demonstrates normal sinus rhythm, heart rate 73, normal axis, no ST-T wave changes, QTC 440  Recent Labs: No results found for requested labs within last 8760 hours.   Recent Lipid Panel No results found for: CHOL, TRIG, HDL, CHOLHDL, LDLCALC,  LDLDIRECT    Risk Assessment/Calculations:      Physical Exam:    VS:  BP 126/88   Pulse 73   Ht 5\' 4"  (1.626 m)   Wt 223 lb (101.2 kg)   SpO2 99%   BMI 38.28 kg/m     Wt Readings from Last 3 Encounters:  02/29/20 223 lb (101.2 kg)  08/10/19 220 lb (99.8 kg)  02/22/19 215 lb 3.2 oz (97.6 kg)     Constitutional:      Appearance: Healthy appearance. Not in distress.  Neck:     Vascular: JVD normal.  Pulmonary:     Effort: Pulmonary effort is normal.     Breath sounds: No wheezing. No rales.  Cardiovascular:     Normal rate.  Regular rhythm. Normal S1. Normal S2.     Murmurs: There is no murmur.  Edema:    Peripheral edema absent.  Abdominal:     Palpations: Abdomen is soft.  Skin:    General: Skin is warm and dry.  Neurological:     Mental Status: Alert and oriented to person, place and time.     Cranial Nerves: Cranial nerves are intact.       ASSESSMENT & PLAN:    1. Essential hypertension 2. Palpitations Blood pressure is fairly well controlled. Diastolic pressure could be better. She does note some palpitations at night when she lays down. I have suggested that she change the timing of her metoprolol to take it in the evening. If this does not work, she can try to split it in half and take a half a tablet twice a day. I have suggested that she increase activity and exercise regularly. We also discussed the importance of limiting salt and weight loss.    Dispo:  Return in about 1 year (around 02/28/2021) for Routine Follow Up, w/ Dr. Acie Liu, or Teresa Dopp, PA-C, in person.   Medication Adjustments/Labs and Tests Ordered: Current medicines are reviewed at length with the patient today.  Concerns regarding medicines are outlined above.  Tests Ordered: Orders Placed This Encounter  Procedures  . EKG 12-Lead   Medication Changes: No orders of the defined types were placed in this encounter.   Signed, Teresa Dopp, PA-C  02/29/2020 3:26 PM    [facility] [place], [place], [place]  [address] Phone: [phone]; Fax: [phone]

## 2020-04-20 ENCOUNTER — Other Ambulatory Visit: Payer: Self-pay | Admitting: Physician Assistant

## 2020-06-25 ENCOUNTER — Telehealth (HOSPITAL_COMMUNITY): Payer: Self-pay | Admitting: Psychiatry

## 2020-06-25 NOTE — Telephone Encounter (Signed)
D:  Returned call to previous MH-IOP pt (Nov. 2020), who wants to return to [place].  A:  Re-oriented pt.  Will update pt's CCA on 06-26-20 @ 10 a.m; pt to start virtual MH-IOP on 06-27-20.  R:  Pt receptive.

## 2020-06-26 ENCOUNTER — Other Ambulatory Visit (HOSPITAL_COMMUNITY): Payer: BC Managed Care – PPO | Attending: Psychiatry | Admitting: Psychiatry

## 2020-06-26 ENCOUNTER — Other Ambulatory Visit: Payer: Self-pay

## 2020-06-26 DIAGNOSIS — Z79899 Other long term (current) drug therapy: Secondary | ICD-10-CM | POA: Insufficient documentation

## 2020-06-26 DIAGNOSIS — F332 Major depressive disorder, recurrent severe without psychotic features: Secondary | ICD-10-CM | POA: Insufficient documentation

## 2020-06-26 DIAGNOSIS — F411 Generalized anxiety disorder: Secondary | ICD-10-CM | POA: Insufficient documentation

## 2020-06-26 NOTE — Progress Notes (Signed)
Comprehensive Clinical Assessment (CCA) Note  06/26/2020 Teresa Liu [id number]  Chief Complaint:  Chief Complaint  Patient presents with  . Anxiety  . Depression   Visit Diagnosis: F33.2Virtual Visit via Video Note  I connected with Teresa Liu on @TODAY @ at 10:00 AM EDT by a video enabled telemedicine application and verified that I am speaking with the correct person using two identifiers.  Location: Patient: at home Provider: at office   I discussed the limitations of evaluation and management by telemedicine and the availability of in person appointments. The patient expressed understanding and agreed to proceed.  I discussed the assessment and treatment plan with the patient. The patient was provided an opportunity to ask questions and all were answered. The patient agreed with the plan and demonstrated an understanding of the instructions.   The patient was advised to call back or seek an in-person evaluation if the symptoms worsen or if the condition fails to improve as anticipated.  I provided 60 minutes of non-face-to-face time during this encounter.   Teresa Liu, Teresa Liu, M.Ed, CNA      CCA Screening, Triage and Referral (STR)  Patient Reported Information How did you hear about [place]? Other (Comment)  Referral name: Psychiatrist  Referral phone number: No data recorded  Whom do you see for routine medical problems? Primary Care  Practice/Facility Name: Teresa Liu  Practice/Facility Phone Number: No data recorded Name of Contact: No data recorded Contact Number: No data recorded Contact Fax Number: No data recorded Prescriber Name: Teresa Liu  Prescriber Address (if known): No data recorded  What Is the Reason for Your Visit/Call Today? worsening depressive/anxiety symptoms  How Long Has This Been Causing You Problems? > than 6 months  What Do You Feel Would Help You the Most Today? Stress Management; Treatment for Depression or other  mood problem   Have You Recently Been in Any Inpatient Treatment (Hospital/Detox/Crisis Center/28-Day Program)? No  Name/Location of Program/Hospital:No data recorded How Long Were You There? No data recorded When Were You Discharged? No data recorded  Have You Ever Received Services From [facility] Before? Yes  Who Do You See at [facility]? Teresa Liu 2020   Have You Recently Had Any Thoughts About Hurting Yourself? No  Are You Planning to Commit Suicide/Harm Yourself At This time? No   Have you Recently Had Thoughts About [place]? No  Explanation: No data recorded  Have You Used Any Alcohol or Drugs in the Past 24 Hours? No  How Long Ago Did You Use Drugs or Alcohol? No data recorded What Did You Use and How Much? No data recorded  Do You Currently Have a Therapist/Psychiatrist? Yes  Name of Therapist/Psychiatrist: Dr. Chucky Liu and Teresa Cobbs, LCSW   Have You Been Recently Discharged From Any Office Practice or Programs? No  Explanation of Discharge From Practice/Program: No data recorded    CCA Screening Triage Referral Assessment Type of Contact: No data recorded Is this Initial or Reassessment? No data recorded Date Telepsych consult ordered in [facility]:  No data recorded Time Telepsych consult ordered in [facility]L:  No data recorded  Patient Reported Information Reviewed? No data recorded Patient Left Without Being Seen? No data recorded Reason for Not Completing Assessment: No data recorded  Collateral Involvement: Pt states her father and friends are supportive.   Does Patient Have a Stage manager Guardian? No data recorded Name and Contact of Legal Guardian: No data recorded If Minor and Not Living with Parent(s), Who has  Custody? No data recorded Is CPS involved or ever been involved? Never  Is APS involved or ever been involved? Never   Patient Determined To Be At Risk for Harm To Self or Others Based on Review of Patient Reported  Information or Presenting Complaint? No  Method: No data recorded Availability of Means: No data recorded Intent: No data recorded Notification Required: No data recorded Additional Information for Danger to Others Potential: No data recorded Additional Comments for Danger to Others Potential: No data recorded Are There Guns or Other Weapons in Your Home? No data recorded Types of Guns/Weapons: No data recorded Are These Weapons Safely Secured?                            No data recorded Who Could Verify You Are Able To Have These Secured: No data recorded Do You Have any Outstanding Charges, Pending Court Dates, Parole/Probation? No data recorded Contacted To Inform of Risk of Harm To Self or Others: No data recorded  Location of Assessment: No data recorded  Does Patient Present under Involuntary Commitment? No  IVC Papers Initial File Date: No data recorded  [place] of Residence: [place]   Patient Currently Receiving the Following Services: IOP (Intensive Outpatient Program)   Determination of Need: Routine (7 days)   Options For Referral: Intensive Outpatient Therapy     CCA Biopsychosocial Intake/Chief Complaint:  This is a 45 yr old, married,employed, [name], who was referred per Teresa Liu; treatment for worsening anxiety and depressive symptoms.  States she's been seeing Dr. Toy Care since Feb. 2020; with a hx of seeing a therapist.  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  "There's been a lot of changes.  They want [place] to sell all the time."  Pt reports conflict with her mgr.  "He doesn't care."  States she feels stuck at the job because of the good benefits and pay. Pt states she's not meeting quota.   Dr. Toy Care pulled pt out of work in March 2022.  2)  Unresolved grief/loss issues:  Mother passed in 2007.  "She was my rock."  3) Conflict with 32 yr old son having behavioral issues.  4)  44 yr old daughter  diagnosed with Epilepsy in 2020 (her first yr in college.  Pt denies any inpatient hospitalizations.  Denies hx of suicide attempts or gestures.  Current Symptoms/Problems: increased anxiety, poor sleep, ruminating thoughts, poor concentration, tearful, poor energy, anhedonia, fatigue, sadness, poor self-esteem   Patient Reported Schizophrenia/Schizoaffective Diagnosis in Past: No   Strengths: "I am nice person who is usually the mediator."  Preferences: "I want to work on time management and trying to get another job."  Abilities: No data recorded  Type of Services Patient Feels are Needed: Teresa Liu   Initial Clinical Notes/Concerns: Pt states her hair is falling out d/t stress; Also has psoriasis that has flarred up.  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   Mental Health Symptoms Depression:  Change in energy/activity; Difficulty Concentrating; Fatigue; Increase/decrease in appetite; Sleep (too much or little); Tearfulness; Irritability   Duration of Depressive symptoms: Greater than two weeks   Mania:  N/A   Anxiety:   Difficulty concentrating; Worrying; Tension   Psychosis:  None   Duration of Psychotic symptoms: No data recorded  Trauma:  N/A   Obsessions:  N/A   Compulsions:  N/A   Inattention:  N/A   Hyperactivity/Impulsivity:  N/A   Oppositional/Defiant Behaviors:  N/A   Emotional Irregularity:  N/A   Other Mood/Personality Symptoms:  No data recorded   Mental Status Exam Appearance and self-care  Stature:  Average   Weight:  Overweight   Clothing:  Casual   Grooming:  Normal   Cosmetic use:  None   Posture/gait:  Normal   Motor activity:  Not Remarkable   Sensorium  Attention:  Normal   Concentration:  Normal   Orientation:  X5   Recall/memory:  Normal   Affect and Mood  Affect:  Tearful   Mood:  Depressed   Relating  Eye contact:  Normal   Facial expression:  Sad   Attitude toward examiner:  Cooperative   Thought and Language  Speech  flow: Normal   Thought content:  No data recorded  Preoccupation:  None   Hallucinations:  None   Organization:  No data recorded  [employer] of Knowledge:  Average   Intelligence:  Average   Abstraction:  Normal   Judgement:  Normal   Reality Testing:  Adequate   Insight:  Good   Decision Making:  Vacilates   Social Functioning  Social Maturity:  Isolates   Social Judgement:  Normal   Stress  Stressors:  Grief/losses; Family conflict; Work   Coping Ability:  Programme researcher, broadcasting/film/video Deficits:  Activities of daily living; Environmental health practitioner; Self-care   Supports:  Friends/Service system     Religion: Religion/Spirituality Are You A Religious Person?: Yes What is Your Religious Affiliation?: Personal assistant: Leisure / [place]?: Yes Leisure and Hobbies: watching tv  Exercise/Diet: Exercise/Diet Do You Exercise?: Yes What Type of Exercise Do You Do?: Run/Walk How Many Times a Week Do You Exercise?: 1-3 times a week Have You Gained or Lost A Significant Amount of Weight in the Past Six Months?: No Do You Follow a Special Diet?: No Do You Have Any Trouble Sleeping?: Yes Explanation of Sleeping Difficulties: Difficulty staying asleep d/t ruminating thoughts   CCA Employment/Education Employment/Work Situation: Employment / Work Situation Employment situation: Employed Where is patient currently employed?: [employer] How long has patient been employed?: 21 yrs Patient's job has been impacted by current illness: Yes Describe how patient's job has been impacted: Not able to function Has patient ever been in the [employer]?: No  Education: Education Is Patient Currently Attending School?: No Did Teacher, adult education From [employer]?: Yes Did Physicist, medical?: Yes What Type of College Degree Do you Have?: BA Did You Attend Graduate School?: No 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 Did You Have An Individualized  Education Program (IIEP): No Did You Have Any Difficulty At School?: No   CCA Family/Childhood History Family and Relationship History: Family history Marital status: Married Number of Years Married: 20 What types of issues is patient dealing with in the relationship?: "I don't think he understands my illness." Are you sexually active?: Yes What is your sexual orientation?: heterosexual Does patient have children?: Yes How many children?: 2 How is patient's relationship with their children?: Has a 84 yr old and 56 yr old son  Childhood History:  Childhood History By whom was/is the patient raised?: Both parents Additional childhood history information: Born and raised in [place], [place].  Reports "good childhood."  "Our parents loved [place]."  States she had no problems in school.  Denies any trauma or abuse. Description of patient's relationship with caregiver when they were a child: Very close to mother.  Does patient have siblings?: Yes Number of Siblings: 2 Description of patient's current relationship with siblings: Close to older sister and older brother Did patient suffer any verbal/emotional/physical/sexual abuse as a child?: No Did patient suffer from severe childhood neglect?: No Was the patient ever a victim of a crime or a disaster?: No Witnessed domestic violence?: No Has patient been affected by domestic violence as an adult?: No  Child/Adolescent Assessment:     CCA Substance Use Alcohol/Drug Use: Alcohol / Drug Use Pain Medications: cc:  MAR Prescriptions: cc: EPIC Over the Counter: cc:  MAR History of alcohol / drug use?: No history of alcohol / drug abuse                         ASAM's:  Six Dimensions of Multidimensional Assessment  Dimension 1:  Acute Intoxication and/or Withdrawal Potential:      Dimension 2:  Biomedical Conditions and Complications:      Dimension 3:  Emotional, Behavioral, or Cognitive Conditions and Complications:      Dimension 4:  Readiness to Change:     Dimension 5:  Relapse, Continued use, or Continued Problem Potential:     Dimension 6:  Recovery/Living Environment:     ASAM Severity Score:    ASAM Recommended Level of Treatment:     Substance use Disorder (SUD)    Recommendations for Services/Supports/Treatments: Recommendations for Services/Supports/Treatments Recommendations For Services/Supports/Treatments: IOP (Intensive Outpatient Program)  DSM5 Diagnoses: Patient Active Problem List   Diagnosis Date Noted  . HTN (hypertension) 08/20/2015  . Palpitations 03/14/2015  . 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 Re-oriented pt to Teresa Liu.  Pt gave verbal consent for treatment, to release chart information to referred providers and to complete any forms if needed.  Pt also gave consent for attending group virtually d/t COVID-19 social distancing restrictions.  Encouraged support groups.  F/U with Dr. Milagros Evener and Hurley Cisco, LCSW.  Referrals to Alternative Service(s): Referred to Alternative Service(s):   Place:   Date:   Time:    Referred to Alternative Service(s):   Place:   Date:   Time:    Referred to Alternative Service(s):   Place:   Date:   Time:    Referred to Alternative Service(s):   Place:   Date:   Time:     Jeri Modena, M.Ed,CNA

## 2020-06-27 ENCOUNTER — Encounter (HOSPITAL_COMMUNITY): Payer: Self-pay

## 2020-06-27 ENCOUNTER — Other Ambulatory Visit (HOSPITAL_COMMUNITY): Payer: BC Managed Care – PPO | Admitting: Psychiatry

## 2020-06-27 ENCOUNTER — Other Ambulatory Visit: Payer: Self-pay

## 2020-06-27 DIAGNOSIS — F332 Major depressive disorder, recurrent severe without psychotic features: Secondary | ICD-10-CM | POA: Diagnosis not present

## 2020-06-27 DIAGNOSIS — Z79899 Other long term (current) drug therapy: Secondary | ICD-10-CM | POA: Diagnosis not present

## 2020-06-27 DIAGNOSIS — F411 Generalized anxiety disorder: Secondary | ICD-10-CM | POA: Diagnosis not present

## 2020-06-27 NOTE — Progress Notes (Signed)
Virtual Visit via Video Note  I connected with Teresa Liu on 06/29/20 at  9:00 AM EDT by a video enabled telemedicine application and verified that I am speaking with the correct person using two identifiers.  Location: Patient: home Provider: office   I discussed the limitations of evaluation and management by telemedicine and the availability of in person appointments. The patient expressed understanding and agreed to proceed.   I discussed the assessment and treatment plan with the patient. The patient was provided an opportunity to ask questions and all were answered. The patient agreed with the plan and demonstrated an understanding of the instructions.   The patient was advised to call back or seek an in-person evaluation if the symptoms worsen or if the condition fails to improve as anticipated.  I provided 15 minutes of non-face-to-face time during this encounter.   Derrill Center, NP    Psychiatric Initial Adult Assessment   Patient Identification: Teresa Liu MRN:  [id number] Date of Evaluation:  06/27/2020 Referral Source: self referral  Chief Complaint:   Chief Complaint    Anxiety; Depression; Stress     Visit Diagnosis:    ICD-10-CM   1. GAD (generalized anxiety disorder)  F41.1     History of Present Illness:  [name] is a 45 year old African-American female that presents for worsening depression and anxiety.  Reports multiple stressors related to current employer.  States she is employed at [employer] for the past 43+ years  Reports work demands has become overwhelming.  States ongoing grief and loss related to her mother this past May 45  Reported her son has been having behavioral issues mainly at school.  States her 43 year old was recently diagnosed with epilepsy reports worklife balance has been difficult to manage.  Kjirsten reports mood irritability,decreased concentration and worsening  depression.  Denies suicidal or homicidal  ideations.  Denies auditory or visual hallucinations.  Reports she is currently followed by therapy and psychiatry.  Denied previous inpatient admissions.  Reports she has completed intensive outpatient programming in the past.  States she is prescribed Remeron 15 mg, lithium 450 mg.  She reports taking and tolerating medications well.  Patient to start intensive outpatient programming on 06/27/2020.   Associated Signs/Symptoms: Depression Symptoms:  depressed mood, suicidal attempt, anxiety, (Hypo) Manic Symptoms:  Distractibility, Irritable Mood, Anxiety Symptoms:  Excessive Worry, Social Anxiety, Psychotic Symptoms:  Hallucinations: None PTSD Symptoms: Had a traumatic exposure:  .  Past Psychiatric History:   Previous Psychotropic Medications: No   Substance Abuse History in the last 12 months:  No.  Consequences of Substance Abuse: NA  Past Medical History:  Past Medical History:  Diagnosis Date  . Anxiety   . Depression   . HTN (hypertension) 08/20/2015  . Meningioma ([place])   . Migraines     Past Surgical History:  Procedure Laterality Date  . CRANIOTOMY FOR TUMOR  08/26/2010   right frontal craniotomy, gross total resection of brain tumor    Family Psychiatric History:   Family History:  Family History  Problem Relation Age of Onset  . Cancer Mother        pancreatic  . Hypertension Father   . Glaucoma Maternal Grandmother   . Colon cancer Paternal Grandmother   . Diabetes Maternal Aunt   . Diabetes Maternal Uncle   . Diabetes Paternal Aunt   . Diabetes Paternal Uncle   . Heart attack Neg Hx     Social History:   Social History   Socioeconomic History  .  Marital status: Married    Spouse name: Not on file  . Number of children: 2  . Years of education: Not on file  . Highest education level: Bachelor's degree (e.g., BA, AB, BS)  Occupational History  . Not on file  Tobacco Use  . Smoking status: Never Smoker  . Smokeless tobacco: Never Used  Vaping  Use  . Vaping Use: Never used  Substance and Sexual Activity  . Alcohol use: No  . Drug use: No  . Sexual activity: Yes    Partners: Male    Birth control/protection: Inserts    Comment: 1st intercourse- 62, partner-1, married- 12 yrs   Other Topics Concern  . Not on file  Social History Narrative  . Not on file   Social Determinants of Health   Financial Resource Strain: Not on file  Food Insecurity: Not on file  Transportation Needs: Not on file  Physical Activity: Not on file  Stress: Not on file  Social Connections: Not on file    Additional Social History:   Allergies:  No Known Allergies  Metabolic Disorder Labs: No results found for: HGBA1C, MPG No results found for: PROLACTIN No results found for: CHOL, TRIG, HDL, CHOLHDL, VLDL, LDLCALC No results found for: TSH  Therapeutic Level Labs: No results found for: LITHIUM No results found for: CBMZ No results found for: VALPROATE  Current Medications: Current Outpatient Medications  Medication Sig Dispense Refill  . betamethasone dipropionate 0.05 % cream APPLY ON THE SKIN TWICE DAILY.TAPER USE AS ABLE    . etonogestrel-ethinyl estradiol (NUVARING) 0.12-0.015 MG/24HR vaginal ring Place 1 each vaginally every 28 (twenty-eight) days. 3 each 4  . fluocinonide ointment (LIDEX) 0.05 % APPLY DAILY IN THICK SKIN AREAS AND SPARINGLY IN THIN SKIN AREAS AS DIRECTED    . folic acid (FOLVITE) 1 MG tablet TAKE 1 TABLET BY MOUTH EVERY DAY    . ibuprofen (ADVIL) 600 MG tablet Take 600 mg by mouth every 6 (six) hours as needed.    Marland Kitchen levothyroxine (SYNTHROID) 50 MCG tablet Take 50 mcg by mouth daily.    Marland Kitchen lithium carbonate (ESKALITH) 450 MG CR tablet Take by mouth daily at 10 pm. At bedtime    . methotrexate (RHEUMATREX) 2.5 MG tablet Take 2.5 mg by mouth once a week. Take 6 tablets by mouth weekly    . metoprolol succinate (TOPROL-XL) 100 MG 24 hr tablet TAKE 1 TABLET BY MOUTH EVERY DAY 90 tablet 3  . mirtazapine (REMERON) 15 MG  tablet Take 15 mg by mouth at bedtime.    . mometasone (ELOCON) 0.1 % lotion Apply 3 drops in affected ear(s) twice each week. .    . OLANZapine-FLUoxetine (SYMBYAX) 6-25 MG capsule TAKE 1 CAPSULE BY MOUTH EVERYDAY AT BEDTIME     No current facility-administered medications for this visit.    Musculoskeletal: Strength & Muscle Tone: within normal limits Gait & Station: normal Patient leans: N/A  Psychiatric Specialty Exam: Review of Systems  Psychiatric/Behavioral: Negative for behavioral problems. The patient is not hyperactive.   All other systems reviewed and are negative.   There were no vitals taken for this visit.There is no height or weight on file to calculate BMI.  General Appearance: Casual  Eye Contact:  Fair  Speech:  Clear and Coherent  Volume:  Normal  Mood:  Anxious and Depressed  Affect:  Congruent  Thought Process:  Coherent  Orientation:  Full (Time, Place, and Person)  Thought Content:  Logical  Suicidal Thoughts:  No  Homicidal Thoughts:  No  Memory:  Immediate;   Fair Recent;   Fair  Judgement:  Good  Insight:  Good  Psychomotor Activity:  Normal  Concentration:  Concentration: Fair  Recall:  Good  Fund of Knowledge:Fair  Language: Fair  Akathisia:  No  Handed:  Right  AIMS (if indicated):    Assets:  Communication Skills Desire for Improvement Resilience Social Support  ADL's:  Intact  Cognition: WNL  Sleep:  Fair   Screenings: Science writer from 06/26/2020 in [facility]  PHQ-2 Total Score 6  PHQ-9 Total Score 21    Flowsheet Row Counselor from 06/26/2020 in [facility] Error: Question 6 not populated      Assessment and Plan:  Errica to start Intensive Outpatient Programming   Treatment plan was reviewed and agreed upon by NP T.Bobby Rumpf and patient Jordyn Hofacker need for group services.    Derrill Center, NP 5/4/202210:46 AM

## 2020-06-27 NOTE — Progress Notes (Signed)
Virtual Visit via Video Note  I connected with Teresa Liu on 06/27/20 at  9:00 AM EDT by a video enabled telemedicine application and verified that I am speaking with the correct person using two identifiers.  on 06/27/20 at  9:00 AM EDT by a video enabled telemedicine application and verified that I am speaking with the correct person using two identifiers.  At orientation to the IOP program, Case Manager discussed the limitations of evaluation and management by telemedicine and the availability of in person appointments. The patient expressed understanding and agreed to proceed with virtual visits throughout the duration of the program.   Location:  Patient: Patient Home Provider: Home Office   History of Present Illness: MDD  Observations/Objective: Check In: Case Manager checked in with all participants to review discharge dates, insurance authorizations, work-related documents and needs from the treatment team regarding medications. Client stated needs and engaged in discussion. Case Manager introduced a new Client to the group with the group members beginning the joining process.   Initial Therapeutic Activity: Counselor facilitated a check-in with group members to assess mood and current functioning. Client shared details of their mental health management since our last session, including challenges and successes. Counselor engaged group in discussion, covering the following topics: mood stabilization, anxiety, depression, advocacy and discharge planning. Client presents with moderate depression and moderate anxiety. Client denied any current SI/HI/psychosis.    Second Therapeutic Activity: Counselor introduced our guest speaker, Einar Grad, [employer] Pharmacist, who shared about psychiatric medications, side effects, treatment considerations and how to communicate with medical professionals. Group Members asked questions and shared medication concerns. Counselor prompted group members  to reference a worksheet called, "Body Scan" to jot down questions and concerns about their physical health in preparation for their upcoming appointments with medical professionals. Client noted a variety of issues with health that she is working to establish care. Client noted financial challenges with receiving appropriate care.  Counselor encouraged routine medical check-ups, preparing for appointments, following up with recommendations and seeking specialist if needed.   Check Out: Counselor prompted group members to share what self-care practice or productivity activity they will engage in today. Client plans to go outside for fresh air. Client endorsed safety plan to be followed to prevent safety issues.   Assessment and Plan: Clinician recommends that Client remain in IOP treatment to better manage mental health symptoms, stabilization and to address treatment plan goals. Clinician recommends adherence to crisis/safety plan, taking medications as prescribed, and following up with medical professionals if any issues arise.   Follow Up Instructions: Clinician will send Webex link for next session. The Client was advised to call back or seek an in-person evaluation if the symptoms worsen or if the condition fails to improve as anticipated.     I provided 180 minutes of non-face-to-face time during this encounter.     Lise Auer, LCSW

## 2020-06-28 ENCOUNTER — Other Ambulatory Visit (HOSPITAL_COMMUNITY): Payer: BC Managed Care – PPO | Admitting: Psychiatry

## 2020-06-28 ENCOUNTER — Other Ambulatory Visit: Payer: Self-pay

## 2020-06-28 DIAGNOSIS — F332 Major depressive disorder, recurrent severe without psychotic features: Secondary | ICD-10-CM | POA: Diagnosis not present

## 2020-06-28 DIAGNOSIS — F411 Generalized anxiety disorder: Secondary | ICD-10-CM

## 2020-06-28 DIAGNOSIS — F322 Major depressive disorder, single episode, severe without psychotic features: Secondary | ICD-10-CM

## 2020-06-29 ENCOUNTER — Encounter (HOSPITAL_COMMUNITY): Payer: Self-pay

## 2020-06-29 ENCOUNTER — Other Ambulatory Visit (HOSPITAL_COMMUNITY): Payer: BC Managed Care – PPO | Admitting: Psychiatry

## 2020-06-29 ENCOUNTER — Other Ambulatory Visit: Payer: Self-pay

## 2020-06-29 DIAGNOSIS — F322 Major depressive disorder, single episode, severe without psychotic features: Secondary | ICD-10-CM

## 2020-06-29 DIAGNOSIS — F411 Generalized anxiety disorder: Secondary | ICD-10-CM

## 2020-06-29 DIAGNOSIS — F332 Major depressive disorder, recurrent severe without psychotic features: Secondary | ICD-10-CM | POA: Diagnosis not present

## 2020-06-29 NOTE — Progress Notes (Signed)
Virtual Visit via Video Note  I connected with Teresa Liu on 06/28/20 at  9:00 AM EDT by a video enabled telemedicine application and verified that I am speaking with the correct person using two identifiers.  At orientation to the IOP program, Case Managerdiscussed the limitations of evaluation and management by telemedicine and the availability of in person appointments. The patient expressed understanding and agreed to proceed with virtual visits throughout the duration of the program.  Location:  Patient: Patient Home Provider: Home Office  History of Present Illness: GAD and MDD  Observations/Objective: Check In: Case Manager checked in with all participants to review discharge dates, insurance authorizations, work-related documents and needs from the treatment team regarding medications. Client stated needs and engaged in discussion. Case Manager introduced a new Client to the group with the group members beginning the joining process.   Initial Therapeutic Activity: Counselor presented information and discussion on Grief and Loss. Group members engaged in discussion, taking note of losses that impact them today, grief work, philosophies on grief and recovering. Counselor prompted group to spend 10-15 minutes journaling to process personal grief and loss situations. Counselor processed entries with group and client's identified areas for additional processing in individual therapy. Client noted grief related to loss of her mother.   Second Therapeutic Activity: Counselor facilitated a check-in with group members to assess mood and current functioning. Client shared details of their mental health management since our last session, including challenges and successes. Counselor engaged group in discussion, covering the following topics: emotional support animals, how anxiety impacts physical health, coping with stress, utilizing individual therapy for preparing for hard  conversations, grief and loss support groups, and addressing psychosis. Client presents with moderate depression and moderate anxiety. Client denied any current SI/HI/psychosis. Client has recent history of SI.   Check Out: Counselor prompted group members to share what self-care practice or productivity activity they will engage in today. Client plans to take a nap. Client endorsed safety plan to be followed to prevent safety issues.   Assessment and Plan: Clinician recommends that Client remain in IOP treatment to better manage mental health symptoms, stabilization and to address treatment plan goals. Clinician recommends adherence to crisis/safety plan, taking medications as prescribed, and following up with medical professionals if any issues arise.  Follow Up Instructions: Clinician will send Webex link for next session. The Client was advised to call back or seek an in-person evaluation if the symptoms worsen or if the condition fails to improve as anticipated.   I provided145minutes of non-face-to-face time during this encounter.   Lise Auer, LCSW

## 2020-06-29 NOTE — Progress Notes (Signed)
Virtual Visit via Video Note  I connected with Teresa Liu on 06/29/20 at  9:00 AM EDT by a video enabled telemedicine application and verified that I am speaking with the correct person using two identifiers.  At orientation to the IOP program, Case Manager discussed the limitations of evaluation and management by telemedicine and the availability of in person appointments. The patient expressed understanding and agreed to proceed with virtual visits throughout the duration of the program.   Location:  Patient: Patient Home Provider: Home Office   History of Present Illness: GAD and MDD  Observations/Objective: Check In: Case Manager checked in with all participants to review discharge dates, insurance authorizations, work-related documents and needs from the treatment team regarding medications. Client stated needs and engaged in discussion. Case Manager introduced 2 new Clients to the group with the group members beginning the joining process.   Initial Therapeutic Activity: Counselor facilitated a check-in with group members to assess mood and current functioning. Client shared details of their mental health management since our last session, including challenges and successes. Counselor engaged group in discussion, covering the following topics: preparing for mixed emotions of Mother's Day, psychological testing resources, importance of exercise and hydration, impact of MH on relationships, and childhood trauma. Client presents with moderate depression and moderate anxiety. Client denied any current SI/HI/psychosis.    Second Therapeutic Activity: Counselor engaged group in the creation of an ECOMap to assess current relationships, energy lines, dynamics and boundaries. Counselor provided instructions on how to create symbols to signify patterns. Counselor challenged group members to take a step back to notice themes and trends, strengths and areas for change to create individualized  goals. Group will present their ECOMaps during the next session.    Check Out: Counselor prompted group members to share what self-care practice or productivity activity they will engage over the weekend. Client plans to rest and relax. Client endorsed safety plan to be followed to prevent safety issues.   Assessment and Plan: Clinician recommends that Client remain in IOP treatment to better manage mental health symptoms, stabilization and to address treatment plan goals. Clinician recommends adherence to crisis/safety plan, taking medications as prescribed, and following up with medical professionals if any issues arise.   Follow Up Instructions: Clinician will send Webex link for next session. The Client was advised to call back or seek an in-person evaluation if the symptoms worsen or if the condition fails to improve as anticipated.     I provided 180 minutes of non-face-to-face time during this encounter.     Lise Auer, LCSW

## 2020-07-02 ENCOUNTER — Encounter (HOSPITAL_COMMUNITY): Payer: Self-pay

## 2020-07-02 ENCOUNTER — Other Ambulatory Visit (HOSPITAL_COMMUNITY): Payer: BC Managed Care – PPO | Admitting: Psychiatry

## 2020-07-02 ENCOUNTER — Other Ambulatory Visit: Payer: Self-pay

## 2020-07-02 DIAGNOSIS — F332 Major depressive disorder, recurrent severe without psychotic features: Secondary | ICD-10-CM | POA: Diagnosis not present

## 2020-07-02 DIAGNOSIS — F322 Major depressive disorder, single episode, severe without psychotic features: Secondary | ICD-10-CM

## 2020-07-02 NOTE — Progress Notes (Signed)
Virtual Visit via Video Note  I connected with Teresa Liu on 07/02/20 at  9:00 AM EDT by a video enabled telemedicine application and verified that I am speaking with the correct person using two identifiers.  At orientation to the IOP program, Case Manager discussed the limitations of evaluation and management by telemedicine and the availability of in person appointments. The patient expressed understanding and agreed to proceed with virtual visits throughout the duration of the program.   Location:  Patient: Patient Home Provider: Home Office   History of Present Illness: MDD  Observations/Objective: Check In: Case Manager checked in with all participants to review discharge dates, insurance authorizations, work-related documents and needs from the treatment team regarding medications. Client stated needs and engaged in discussion.   Initial Therapeutic Activity: Counselor facilitated a check-in with group members to assess mood and current functioning. Client shared details of their mental health management since our last session, including challenges and successes. Counselor engaged group in discussion, covering the following topics: shifting approaches, assertive communication, family dynamics, advocating for MH, compulsions, mania, progress on goals, and stage of life issues. Client presents with moderate depression and moderate anxiety. Client denied any current SI/HI/psychosis.    Second Therapeutic Activity: Counselor engaged group in [employer] results, where they assessed current relationships, energy lines, dynamics and boundaries. Counselor challenged group members to take a step back to notice themes and trends, strengths and areas for change to create individualized goals. Group members who presented last week shared how they discussed their ECOMap with family or friends and how they approached relationships differently since our last session. Client engaged  appropriately in activity.    Check Out: Counselor prompted group members to identify and share who their mentors are, with Client stating their dad is their mentor. Counselor prompted group members to share what self-care practice or productivity activity they will engage over the weekend. Client plans to wash hair. Client endorsed safety plan to be followed to prevent safety issues.   Assessment and Plan: Clinician recommends that Client remain in IOP treatment to better manage mental health symptoms, stabilization and to address treatment plan goals. Clinician recommends adherence to crisis/safety plan, taking medications as prescribed, and following up with medical professionals if any issues arise.   Follow Up Instructions: Clinician will send Webex link for next session. The Client was advised to call back or seek an in-person evaluation if the symptoms worsen or if the condition fails to improve as anticipated.     I provided 180 minutes of non-face-to-face time during this encounter.     Lise Auer, LCSW

## 2020-07-03 ENCOUNTER — Other Ambulatory Visit (HOSPITAL_COMMUNITY): Payer: BC Managed Care – PPO

## 2020-07-03 ENCOUNTER — Other Ambulatory Visit: Payer: Self-pay

## 2020-07-04 ENCOUNTER — Other Ambulatory Visit: Payer: Self-pay

## 2020-07-04 ENCOUNTER — Other Ambulatory Visit (HOSPITAL_COMMUNITY): Payer: BC Managed Care – PPO | Admitting: Psychiatry

## 2020-07-04 DIAGNOSIS — F411 Generalized anxiety disorder: Secondary | ICD-10-CM

## 2020-07-04 DIAGNOSIS — F332 Major depressive disorder, recurrent severe without psychotic features: Secondary | ICD-10-CM | POA: Diagnosis not present

## 2020-07-04 DIAGNOSIS — F322 Major depressive disorder, single episode, severe without psychotic features: Secondary | ICD-10-CM

## 2020-07-04 NOTE — Progress Notes (Signed)
Virtual Visit via Video Note  I connected with Teresa Liu on 07/04/20 at  9:00 AM EDT by a video enabled telemedicine application and verified that I am speaking with the correct person using two identifiers.  At orientation to the IOP program, Case Manager discussed the limitations of evaluation and management by telemedicine and the availability of in person appointments. The patient expressed understanding and agreed to proceed with virtual visits throughout the duration of the program.   Location:  Patient: Patient Home Provider: Home Office   History of Present Illness: MDD  Observations/Objective: Check In: Case Manager checked in with all participants to review discharge dates, insurance authorizations, work-related documents and needs from the treatment team regarding medications. Client stated needs and engaged in discussion.   Initial Therapeutic Activity: Counselor facilitated a check-in with group members to assess mood and current functioning. Client shared details of their mental health management since our last session, including challenges and successes. Counselor engaged group in discussion, covering the following topics: childhood traumas, unsupportive partners, coping with anxiety in public, having hard conversations and preventative work for upcoming events. Client presents with moderate depression and moderate anxiety. Client denied any current SI/HI/psychosis.   Second Therapeutic Activity: Counselor introduced [employer], [place] Chaplain to present information and discussion on Grief and Loss. Group members engaged in discussion, sharing how grief impacts them, what comforts them, what emotions are felt, labeling losses, etc. After guest speaker logged off, Counselor prompted group to spend 10-15 minutes journaling to process personal grief and loss situations. Counselor processed entries with group and client's identified areas for additional processing in  individual therapy. Client noted that she appreciated the topics discussed and is helping her to process her individual losses.   Check Out: Counselor prompted group members to share what self-care practice or productivity activity they will engage over the weekend. Client plans to _. Client endorsed safety plan to be followed to prevent safety issues.   Assessment and Plan: Clinician recommends that Client remain in IOP treatment to better manage mental health symptoms, stabilization and to address treatment plan goals. Clinician recommends adherence to crisis/safety plan, taking medications as prescribed, and following up with medical professionals if any issues arise.   Follow Up Instructions: Clinician will send Webex link for next session. The Client was advised to call back or seek an in-person evaluation if the symptoms worsen or if the condition fails to improve as anticipated.     I provided 180 minutes of non-face-to-face time during this encounter.     Lise Auer, LCSW

## 2020-07-05 ENCOUNTER — Ambulatory Visit (HOSPITAL_COMMUNITY): Payer: PRIVATE HEALTH INSURANCE

## 2020-07-05 ENCOUNTER — Encounter (HOSPITAL_COMMUNITY): Payer: Self-pay

## 2020-07-06 ENCOUNTER — Telehealth (HOSPITAL_COMMUNITY): Payer: Self-pay | Admitting: Psychiatry

## 2020-07-06 ENCOUNTER — Ambulatory Visit (HOSPITAL_COMMUNITY): Payer: PRIVATE HEALTH INSURANCE

## 2020-07-09 ENCOUNTER — Other Ambulatory Visit (HOSPITAL_COMMUNITY): Payer: BC Managed Care – PPO

## 2020-07-09 ENCOUNTER — Other Ambulatory Visit: Payer: Self-pay

## 2020-07-10 ENCOUNTER — Ambulatory Visit (HOSPITAL_COMMUNITY): Payer: PRIVATE HEALTH INSURANCE

## 2020-07-11 ENCOUNTER — Other Ambulatory Visit: Payer: Self-pay

## 2020-07-11 ENCOUNTER — Other Ambulatory Visit (HOSPITAL_COMMUNITY): Payer: BC Managed Care – PPO | Admitting: Licensed Clinical Social Worker

## 2020-07-11 DIAGNOSIS — F322 Major depressive disorder, single episode, severe without psychotic features: Secondary | ICD-10-CM

## 2020-07-11 DIAGNOSIS — F332 Major depressive disorder, recurrent severe without psychotic features: Secondary | ICD-10-CM | POA: Diagnosis not present

## 2020-07-11 DIAGNOSIS — F411 Generalized anxiety disorder: Secondary | ICD-10-CM

## 2020-07-12 ENCOUNTER — Other Ambulatory Visit (HOSPITAL_COMMUNITY): Payer: BC Managed Care – PPO | Admitting: Psychiatry

## 2020-07-12 ENCOUNTER — Other Ambulatory Visit: Payer: Self-pay

## 2020-07-12 DIAGNOSIS — F332 Major depressive disorder, recurrent severe without psychotic features: Secondary | ICD-10-CM | POA: Diagnosis not present

## 2020-07-12 DIAGNOSIS — F411 Generalized anxiety disorder: Secondary | ICD-10-CM

## 2020-07-12 DIAGNOSIS — F322 Major depressive disorder, single episode, severe without psychotic features: Secondary | ICD-10-CM

## 2020-07-13 ENCOUNTER — Other Ambulatory Visit (HOSPITAL_COMMUNITY): Payer: BC Managed Care – PPO

## 2020-07-13 ENCOUNTER — Encounter (HOSPITAL_COMMUNITY): Payer: Self-pay

## 2020-07-13 ENCOUNTER — Other Ambulatory Visit: Payer: Self-pay

## 2020-07-13 NOTE — Progress Notes (Signed)
Virtual Visit via Video Note  I connected with Teresa Liu on5/19/22 at  9:00 AM EDT by a video enabled telemedicine application and verified that I am speaking with the correct person using two identifiers.   At orientation to the IOP program, Case Manager discussed the limitations of evaluation and management by telemedicine and the availability of in person appointments. The patient expressed understanding and agreed to proceed with virtual visits throughout the duration of the program.   Location:  Patient: Patient Home Provider: Home Office   History of Present Illness: MDD and GAD  Observations/Objective: Check In: Case Manager checked in with all participants to review discharge dates, insurance authorizations, work-related documents and needs from the treatment team regarding medications. Client stated needs and engaged in discussion.   Initial Therapeutic Activity: Counselor facilitated a check-in with group members to assess mood and current functioning. Client shared details of their mental health management since our last session, including challenges and successes. Counselor engaged group in discussion, covering the following topics: life stressors, securing employment and housing strategies, navigating relationships, COVID survival strategies and chronic pain impacting mental health. Client presents with moderate depression and moderate anxiety. Client denied any current SI/HI/psychosis.   Second Therapeutic Activity: Counselor introduced [place], [place] Chaplain to present information and discussion on Grief and Loss. Group members engaged in discussion, sharing how grief impacts them, what comforts them, what emotions are felt, labeling losses, etc. After guest speaker logged off, Counselor prompted group to spend 10-15 minutes journaling to process personal grief and loss situations. Counselor processed entries with group and client's identified areas for additional  processing in individual therapy. Client noted grief related lost hopes, health issues, work issues, family dynamics.   Check Out: Counselor closed program by allowing time to celebrate a graduating group member. Counselor shared reflections on progress and allow space for group members to share well wishes and appreciates to the graduating client. Counselor prompted graduating client to share takeaways, reflect on progress and final thoughts for the group. Counselor prompted group members to share what self-care practice or productivity activity they will engage in today. Group members shared their plans with the group. Client endorsed safety plan to be followed to prevent safety issues.   Assessment and Plan: Clinician recommends that Client remain in IOP treatment to better manage mental health symptoms, stabilization and to address treatment plan goals. Clinician recommends adherence to crisis/safety plan, taking medications as prescribed, and following up with medical professionals if any issues arise.    Follow Up Instructions: Clinician will send Webex link for next session. The Client was advised to call back or seek an in-person evaluation if the symptoms worsen or if the condition fails to improve as anticipated.     I provided 180 minutes of non-face-to-face time during this encounter.     Lise Auer, LCSW

## 2020-07-16 ENCOUNTER — Other Ambulatory Visit: Payer: Self-pay

## 2020-07-16 ENCOUNTER — Other Ambulatory Visit (HOSPITAL_COMMUNITY): Payer: BC Managed Care – PPO

## 2020-07-17 ENCOUNTER — Other Ambulatory Visit (HOSPITAL_COMMUNITY): Payer: BC Managed Care – PPO | Admitting: Psychiatry

## 2020-07-17 ENCOUNTER — Other Ambulatory Visit: Payer: Self-pay

## 2020-07-17 DIAGNOSIS — F322 Major depressive disorder, single episode, severe without psychotic features: Secondary | ICD-10-CM

## 2020-07-17 DIAGNOSIS — F332 Major depressive disorder, recurrent severe without psychotic features: Secondary | ICD-10-CM | POA: Diagnosis not present

## 2020-07-18 ENCOUNTER — Other Ambulatory Visit: Payer: Self-pay

## 2020-07-18 ENCOUNTER — Other Ambulatory Visit (HOSPITAL_COMMUNITY): Payer: BC Managed Care – PPO

## 2020-07-19 ENCOUNTER — Encounter (HOSPITAL_COMMUNITY): Payer: Self-pay

## 2020-07-19 ENCOUNTER — Other Ambulatory Visit: Payer: Self-pay

## 2020-07-19 ENCOUNTER — Telehealth (HOSPITAL_COMMUNITY): Payer: Self-pay | Admitting: Psychiatry

## 2020-07-19 ENCOUNTER — Other Ambulatory Visit (HOSPITAL_COMMUNITY): Payer: BC Managed Care – PPO | Admitting: Psychiatry

## 2020-07-19 NOTE — Progress Notes (Signed)
Virtual Visit via Video Note  I connected with [name] on 07/17/20 at  9:00 AM EDT by a video enabled telemedicine application and verified that I am speaking with the correct person using two identifiers.  At orientation to the IOP program, Case Manager discussed the limitations of evaluation and management by telemedicine and the availability of in person appointments. The patient expressed understanding and agreed to proceed with virtual visits throughout the duration of the program.   Location:  Patient: Patient Home Provider: Home Office   History of Present Illness: MDD  Observations/Objective: Check In: Case Manager checked in with all participants to review discharge dates, insurance authorizations, work-related documents and needs from the treatment team regarding medications. Client stated needs and engaged in discussion. Case Manager introduced new Client to the group, with group members welcoming and starting the joining process.   Initial Therapeutic Activity: Counselor facilitated a check-in with group members to assess mood and current functioning. Client shared details of their mental health management since our last session, including challenges and successes. Counselor engaged group in discussion, covering the following topics: physical symptoms related to [place], safe alternatives to self-harm, passive SI, applying coping skills, utilizing support system. Client presents with moderate depression and moderate anxiety. Client denied any current SI/HI/psychosis.   Second Therapeutic Activity: Counselor engaged group in discussion on boundaries by sharing about the [email or web address]. Counselor prompted group members to take a brief boundaries assessment. Counselor shared resources on the website to further understanding and application of boundaries in their relationships and environments. Group members shared their scores, where they find boundaries easy to apply  and where they have challenges. We will continue conversation tomorrow.   Check Out: Counselor prompted group members to share what self-care practice or productivity activity they will engage in today. Group members shared their plans with the group. Client endorsed safety plan to be followed to prevent safety issues.   Assessment and Plan: Clinician recommends that Client remain in IOP treatment to better manage mental health symptoms, stabilization and to address treatment plan goals. Clinician recommends adherence to crisis/safety plan, taking medications as prescribed, and following up with medical professionals if any issues arise.    Follow Up Instructions: Clinician will send Webex link for next session. The Client was advised to call back or seek an in-person evaluation if the symptoms worsen or if the condition fails to improve as anticipated.     I provided 180 minutes of non-face-to-face time during this encounter.  Lise Auer, LCSW

## 2020-07-20 ENCOUNTER — Other Ambulatory Visit: Payer: Self-pay

## 2020-07-20 ENCOUNTER — Other Ambulatory Visit (HOSPITAL_COMMUNITY): Payer: BC Managed Care – PPO

## 2020-07-24 ENCOUNTER — Other Ambulatory Visit: Payer: Self-pay

## 2020-07-24 ENCOUNTER — Other Ambulatory Visit (HOSPITAL_COMMUNITY): Payer: BC Managed Care – PPO | Admitting: Licensed Clinical Social Worker

## 2020-07-24 DIAGNOSIS — F322 Major depressive disorder, single episode, severe without psychotic features: Secondary | ICD-10-CM

## 2020-07-24 DIAGNOSIS — F332 Major depressive disorder, recurrent severe without psychotic features: Secondary | ICD-10-CM | POA: Diagnosis not present

## 2020-07-24 DIAGNOSIS — F411 Generalized anxiety disorder: Secondary | ICD-10-CM

## 2020-07-24 NOTE — Progress Notes (Addendum)
Virtual Visit via Video Note  I connected with Teresa Liu on @TODAY @ at  9:00 AM EDT by a video enabled telemedicine application and verified that I am speaking with the correct person using two identifiers.  Location: Patient: at home Provider: at office   I discussed the limitations of evaluation and management by telemedicine and the availability of in person appointments. The patient expressed understanding and agreed to proceed.  I discussed the assessment and treatment plan with the patient. The patient was provided an opportunity to ask questions and all were answered. The patient agreed with the plan and demonstrated an understanding of the instructions.   The patient was advised to call back or seek an in-person evaluation if the symptoms worsen or if the condition fails to improve as anticipated.  I provided 20 minutes of non-face-to-face time during this encounter.   Carlis Abbott, RITA, M.Ed, CNA   Patient ID: Teresa Liu, female   DOB: 07/05/1975, 45 y.o.   MRN: [id number] Intake/Chief Complaint:  This is a 45 yr old, married,employed, [name], who was referred per Dr. Chucky May; treatment for worsening anxiety and depressive symptoms.  States she's been seeing Dr. Toy Care since Feb. 2020; with a hx of seeing a therapist.  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  "There's been a lot of changes.  They want [place] to sell all the time."  Pt reports conflict with her mgr.  "He doesn't care."  States she feels stuck at the job because of the good benefits and pay. Pt states she's not meeting quota.   Dr. Toy Care pulled pt out of work in March 2022.  2)  Unresolved grief/loss issues:  Mother passed in 2007.  "She was my rock."  3) Conflict with 76 yr old son having behavioral issues.  4)  63 yr old daughter diagnosed with Epilepsy in 2020 (her first yr in college.  Pt denies any inpatient hospitalizations.  Denies hx of suicide  attempts or gestures.  Current Symptoms/Problems: increased anxiety, poor sleep, ruminating thoughts, poor concentration, tearful, poor energy, anhedonia, fatigue, sadness, poor self-esteem  Pt remains in virtual MH-IOP.  Pt continues to be struggling her illness along with daughter's illness among other stressors.  Pt is active in all the groups. A:  Pt to continue in [place] thru 08-16-20.  F/U with Dr. Chucky May and Nunzio Cobbs, LCSW.  Dr. Toy Care will determine the RTW date.  R:  Pt receptive.  Dellia Nims, M.Ed,CNA

## 2020-07-25 ENCOUNTER — Other Ambulatory Visit: Payer: Self-pay

## 2020-07-25 ENCOUNTER — Other Ambulatory Visit (HOSPITAL_COMMUNITY): Payer: BC Managed Care – PPO

## 2020-07-26 ENCOUNTER — Other Ambulatory Visit (HOSPITAL_COMMUNITY): Payer: BC Managed Care – PPO | Attending: Psychiatry | Admitting: Psychiatry

## 2020-07-26 ENCOUNTER — Encounter (HOSPITAL_COMMUNITY): Payer: Self-pay

## 2020-07-26 ENCOUNTER — Other Ambulatory Visit: Payer: Self-pay

## 2020-07-26 DIAGNOSIS — F332 Major depressive disorder, recurrent severe without psychotic features: Secondary | ICD-10-CM | POA: Diagnosis present

## 2020-07-26 DIAGNOSIS — F329 Major depressive disorder, single episode, unspecified: Secondary | ICD-10-CM | POA: Diagnosis not present

## 2020-07-26 DIAGNOSIS — F322 Major depressive disorder, single episode, severe without psychotic features: Secondary | ICD-10-CM

## 2020-07-26 DIAGNOSIS — F411 Generalized anxiety disorder: Secondary | ICD-10-CM | POA: Diagnosis not present

## 2020-07-26 NOTE — Progress Notes (Signed)
Virtual Visit via Video Note  I connected with Teresa Liu on 07/26/20 at  9:00 AM EDT by a video enabled telemedicine application and verified that I am speaking with the correct person using two identifiers.  At orientation to the IOP program, Case Manager discussed the limitations of evaluation and management by telemedicine and the availability of in person appointments. The patient expressed understanding and agreed to proceed with virtual visits throughout the duration of the program.   Location:  Patient: Patient Home Provider: Home Office   History of Present Illness: MDD  Observations/Objective: Check In: Case Manager checked in with all participants to review discharge dates, insurance authorizations, work-related documents and needs from the treatment team regarding medications. Client stated needs and engaged in discussion.   Initial Therapeutic Activity: Counselor facilitated a check-in with group members to assess mood and current functioning. Client shared details of their mental health management since our last session, including challenges and successes. Counselor engaged group in discussion, covering the following topics: communication skills, boundary setting, peaceful outlets and thought stopping. Client denied any current SI/HI/psychosis.  Second Therapeutic Activity: Counselor presented information and discussed Grief and Loss as it relates to the group members experience. Group members engaged in discussion, sharing how grief impacts them, what comforts them, what emotions are felt, labeling losses, etc. Counselor prompted group to spend 10-15 minutes journaling to process personal grief and loss situations. Counselor processed entries with group and client's identified areas for additional processing in individual therapy. Client noted loss related to mother's death and maturational losses.    Check Out: Counselor prompted group members to share what self-care  practice or productivity activity they will engage in today. Group members shared their plans with the group. Client endorsed safety plan to be followed to prevent safety issues.   Assessment and Plan: Clinician recommends that Client remain in IOP treatment to better manage mental health symptoms, stabilization and to address treatment plan goals. Clinician recommends adherence to crisis/safety plan, taking medications as prescribed, and following up with medical professionals if any issues arise.    Follow Up Instructions: Clinician will send Webex link for next session. The Client was advised to call back or seek an in-person evaluation if the symptoms worsen or if the condition fails to improve as anticipated.     I provided 180 minutes of non-face-to-face time during this encounter.     Lise Auer, LCSW

## 2020-07-27 ENCOUNTER — Ambulatory Visit (HOSPITAL_COMMUNITY): Payer: PRIVATE HEALTH INSURANCE

## 2020-07-30 ENCOUNTER — Ambulatory Visit (HOSPITAL_COMMUNITY): Payer: PRIVATE HEALTH INSURANCE

## 2020-07-31 ENCOUNTER — Telehealth (HOSPITAL_COMMUNITY): Payer: Self-pay | Admitting: Psychiatry

## 2020-07-31 ENCOUNTER — Ambulatory Visit (HOSPITAL_COMMUNITY): Payer: PRIVATE HEALTH INSURANCE | Admitting: Psychiatry

## 2020-08-01 ENCOUNTER — Ambulatory Visit (HOSPITAL_COMMUNITY): Payer: PRIVATE HEALTH INSURANCE

## 2020-08-02 ENCOUNTER — Telehealth (HOSPITAL_COMMUNITY): Payer: Self-pay | Admitting: Psychiatry

## 2020-08-02 ENCOUNTER — Ambulatory Visit (HOSPITAL_COMMUNITY): Payer: PRIVATE HEALTH INSURANCE | Admitting: Psychiatry

## 2020-08-03 ENCOUNTER — Ambulatory Visit (HOSPITAL_COMMUNITY): Payer: PRIVATE HEALTH INSURANCE

## 2020-08-04 NOTE — Progress Notes (Signed)
Virtual Visit via Video Note  I connected with Teresa Liu on 07/24/20 at  9:00 AM EDT by a video enabled telemedicine application and verified that I am speaking with the correct person using two identifiers.  At orientation to the IOP program, Case Manager discussed the limitations of evaluation and management by telemedicine and the availability of in person appointments. The patient expressed understanding and agreed to proceed with virtual visits throughout the duration of the program.  Location: Patient: patient home Provider: clinical home office   History of Present Illness: MDD    Observations/Objective: 9:00 - 10:00: Clinician led check-in regarding current stressors and situation. Clinician utilized active listening and empathetic response and validated patient emotions. Clinician facilitated processing group on pertinent issues. Patient arrived within time allowed and reports that she is feeling "okay." Patient rates her mood at a 5 on a scale of 1-10 with 10 being great. Pt able to process. Pt engaged in discussion.     10:00 - 11:00: Cln led discussion on setting boundaries as a way to increase self-care. Group members discussed things that are stumbling blocks to them engaging in self-care and worked to determine what boundary could address that stumbling block. Group worked together to determine how to address the boundary. Cln brought in topics of boundaries, assertiveness, thought challenging, and self-care.      11:00 - 12:00: Cln introduced grounding techniques as a coping strategy. Cln utilized handout "Detaching from emotional pain" from [employer]. Group reviewed grounding strategies and how they can apply them to their every day life and in which situations.      Check Out: Counselor prompted group members to share what self-care practice or productivity activity they will engage today. Client endorsed safety plan to be followed to prevent safety  issues.  Assessment and Plan: Clinician recommends that Client remain in IOP treatment to better manage mental health symptoms, stabilization and to address treatment plan goals. Clinician recommends adherence to crisis/safety plan, taking medications as prescribed, and following up with medical professionals if any issues arise.  Follow Up Instructions: Clinician will send Webex link for next session. The Client was advised to call back or seek an in-person evaluation if the symptoms worsen or if the condition fails to improve as anticipated.    I provided 180 minutes of non-face-to-face time during this encounter.   Lorin Glass, LCSW

## 2020-08-04 NOTE — Progress Notes (Signed)
Virtual Visit via Video Note  I connected with Teresa Liu on 07/11/20 at  9:00 AM EDT by a video enabled telemedicine application and verified that I am speaking with the correct person using two identifiers.  At orientation to the IOP program, Case Manager discussed the limitations of evaluation and management by telemedicine and the availability of in person appointments. The patient expressed understanding and agreed to proceed with virtual visits throughout the duration of the program.  Location: Patient: patient home Provider: clinical home office   History of Present Illness: MDD   Observations/Objective: 9:00 - 10:00: Pharmacy group. Pt engaged in discussion.    10:00 - 11:00: Clinician led check-in regarding current stressors and situation. Clinician utilized active listening and empathetic response and validated patient emotions. Clinician facilitated processing group on pertinent issues. Patient arrived within time allowed and reports that she is feeling "in-between" Patient rates her mood at a 6 on a scale of 1-10 with 10 being great. Pt able to process. Pt engaged in discussion.     11:00 - 12:00: Wellness group with J. [place]as on ways overall wellness can support mental health Pt engaged in discussion.      Check Out: Counselor prompted group members to share what self-care practice or productivity activity they will engage today. Client endorsed safety plan to be followed to prevent safety issues  Assessment and Plan: Clinician recommends that Client remain in IOP treatment to better manage mental health symptoms, stabilization and to address treatment plan goals. Clinician recommends adherence to crisis/safety plan, taking medications as prescribed, and following up with medical professionals if any issues arise.  Follow Up Instructions: Clinician will send Webex link for next session. The Client was advised to call back or seek an in-person evaluation if the  symptoms worsen or if the condition fails to improve as anticipated.    I provided 180 minutes of non-face-to-face time during this encounter.   Lorin Glass, LCSW

## 2020-08-06 ENCOUNTER — Ambulatory Visit (HOSPITAL_COMMUNITY): Payer: PRIVATE HEALTH INSURANCE

## 2020-08-07 ENCOUNTER — Encounter (HOSPITAL_COMMUNITY): Payer: Self-pay | Admitting: Family

## 2020-08-07 ENCOUNTER — Other Ambulatory Visit: Payer: Self-pay

## 2020-08-07 ENCOUNTER — Other Ambulatory Visit (HOSPITAL_COMMUNITY): Payer: BC Managed Care – PPO | Admitting: Psychiatry

## 2020-08-07 DIAGNOSIS — F329 Major depressive disorder, single episode, unspecified: Secondary | ICD-10-CM | POA: Diagnosis not present

## 2020-08-07 DIAGNOSIS — F411 Generalized anxiety disorder: Secondary | ICD-10-CM

## 2020-08-07 DIAGNOSIS — F322 Major depressive disorder, single episode, severe without psychotic features: Secondary | ICD-10-CM

## 2020-08-07 NOTE — Progress Notes (Signed)
Virtual Visit via Video Note  I connected with Teresa Liu on @TODAY @ at  9:00 AM EDT by a video enabled telemedicine application and verified that I am speaking with the correct person using two identifiers.  Location: Patient: at home Provider: at home office   I discussed the limitations of evaluation and management by telemedicine and the availability of in person appointments. The patient expressed understanding and agreed to proceed.   I discussed the assessment and treatment plan with the patient. The patient was provided an opportunity to ask questions and all were answered. The patient agreed with the plan and demonstrated an understanding of the instructions.   The patient was advised to call back or seek an in-person evaluation if the symptoms worsen or if the condition fails to improve as anticipated.  I provided 20 minutes of non-face-to-face time during this encounter.   Dellia Nims, M.Ed,CNA   Patient ID: Teresa Liu, female   DOB: [birth year], 45 y.o.   MRN: [id number] As per previous CCA states:  This is a 45 yr old, married,employed, [name], who was referred per Dr. Chucky May; treatment for worsening anxiety and depressive symptoms.  States she's been seeing Dr. Toy Care since Feb. 2020; with a hx of seeing a therapist.  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  "There's been a lot of changes.  They want [place] to sell all the time."  Pt reports conflict with her mgr.  "He doesn't care."  States she feels stuck at the job because of the good benefits and pay. Pt states she's not meeting quota.   Dr. Toy Care pulled pt out of work in March 2022.  2)  Unresolved grief/loss issues:  Mother passed in 2007.  "She was my rock."  3) Conflict with 82 yr old son having behavioral issues.  4)  14 yr old daughter diagnosed with Epilepsy in 2020 (her first yr in college.  Pt denies any inpatient hospitalizations.  Denies hx of  suicide attempts or gestures.   Pt attended 11 MH-IOP days.  She started 06-27-20 but ended up only attending two days per week d/t issues with daughter.  States she is feeling more hopeful.  Reports group was "pretty good."  "I had a good job interview.  It went well, but I just haven't heard back as of yet."  Pt plans to write a thank you letter to the company for allowing her to interview for the position.  On a scale of 1-10 (10 being the worst); pt rated her anxiety and depression at a 6.  Denies SI/HI or A/V hallucinations.  A:  D/C today.  F/U with Dr. Chucky May and Nunzio Cobbs, LCSW next week.  RTW on 08-20-20 without any restrictions.  Encouraged support groups.  R: Pt receptive.  Dellia Nims, M.Ed, CNA

## 2020-08-07 NOTE — Progress Notes (Signed)
Virtual Visit via Video Note  I connected with [name] on 08/07/20 at  9:00 AM EDT by a video enabled telemedicine application and verified that I am speaking with the correct person using two identifiers.    At orientation to the IOP program, Case Manager discussed the limitations of evaluation and management by telemedicine and the availability of in person appointments. The patient expressed understanding and agreed to proceed with virtual visits throughout the duration of the program.   Location:  Patient: Patient Home Provider: Home Office   History of Present Illness: MDD and GAD  Observations/Objective: Check In: Case Manager checked in with all participants to review discharge dates, insurance authorizations, work-related documents and needs from the treatment team regarding medications. Client stated needs and engaged in discussion.   Initial Therapeutic Activity: Counselor facilitated a check-in with group members to assess mood and current functioning. Client shared details of their mental health management since our last session, including challenges and successes. Counselor engaged group in discussion, covering the following topics: substance abuse impacting mental health, work related stress, vocations that support mental health, financial stressors, setting boundaries, navigating stages of life, and updates on coping skills used since last session. Client presents with moderate depression and moderate anxiety. Client denied any current SI/HI/psychosis.  Second Therapeutic Activity: Counselor presented information on Cognitive Distortions. Client shared a video outlining 15 styles of cognitive distortions. Group members shared which ones they do most in their thought processes. Counselor shared 5 strategies for combating distorted thinking. Counselor shared links for resources for Group Members to review for homework.  Check Out: Counselor closed program by allowing time  to celebrate the Client as a graduating group member. Counselor shared reflections on progress and allow space for group members to share well wishes and encouragements with the graduating client. Counselor prompted graduating client to share takeaways, reflect on progress and final thoughts for the group. Counselor prompted group members to share what self-care practice or productivity activity they will engage in today. Group members shared their plans with the group. Client endorsed safety plan to be followed to prevent safety issues.   Assessment and Plan: Clinician recommends that Client remain in IOP treatment to better manage mental health symptoms, stabilization and to address treatment plan goals. Clinician recommends adherence to crisis/safety plan, taking medications as prescribed, and following up with medical professionals if any issues arise.    Follow Up Instructions: Clinician will send Webex link for next session. The Client was advised to call back or seek an in-person evaluation if the symptoms worsen or if the condition fails to improve as anticipated.     I provided 180 minutes of non-face-to-face time during this encounter.     Lise Auer, LCSW

## 2020-08-07 NOTE — Progress Notes (Signed)
Virtual Visit via Video Note  I connected with Teresa Liu on 08/07/20 at  9:00 AM EDT by a video enabled telemedicine application and verified that I am speaking with the correct person using two identifiers.  Location: Patient: Home Provider: Office   I discussed the limitations of evaluation and management by telemedicine and the availability of in person appointments. The patient expressed understanding and agreed to proceed.     I discussed the assessment and treatment plan with the patient. The patient was provided an opportunity to ask questions and all were answered. The patient agreed with the plan and demonstrated an understanding of the instructions.   The patient was advised to call back or seek an in-person evaluation if the symptoms worsen or if the condition fails to improve as anticipated.  I provided  15 minutes of non-face-to-face time during this encounter.   Derrill Center, NP   [facility] Discharge Summary  Teresa Liu [id number]  Admission date:  06/27/2020 Discharge date: 08/07/2020  Reason for admission: Per admission assessment note: Teresa Liu is a 45 year old African-American female that presents for worsening depression and anxiety.  Reports multiple stressors related to current employer.  States she is employed at [employer] for the past 20+ years.  Reports work demands has become overwhelming.  States ongoing grief and loss related to her mother this past May 2017.  Reported her son has been having behavioral issues mainly at school.  States her 43 year old was recently diagnosed with epilepsy reports worklife balance has been difficult to manage.   Progress in Program Toward Treatment Goals: Ongoing, patient attended and participated with daily group session with active and engaged participation.  Staff denied any concerns with suicidal or homicidal ideations expressed during group sessions.   Patient to keep all follow-up outpatient appointments.  Unable to reach at discharge.  Support encouragement reassurance was provided.  Progress (rationale): Keep follow-up with MD Rupinder [facility] and Nunzio Cobbs LCSW   Take all medications as prescribed. Keep all follow-up appointments as scheduled.  Do not consume alcohol or use illegal drugs while on prescription medications. Report any adverse effects from your medications to your primary care provider promptly.  In the event of recurrent symptoms or worsening symptoms, call 911, a crisis hotline, or go to the nearest emergency department for evaluation.    Derrill Center, NP 08/07/2020

## 2020-08-07 NOTE — Patient Instructions (Signed)
D:  Pt completed MH-IOP today.  A:  Discharge today.  Follow up with Dr. Chucky May and Nunzio Cobbs, LCSW next week.  RTW on 08-20-20; without any restrictions.  Encouraged support groups.  R:  Patient receptive.

## 2020-08-08 ENCOUNTER — Ambulatory Visit (HOSPITAL_COMMUNITY): Payer: PRIVATE HEALTH INSURANCE

## 2020-08-09 ENCOUNTER — Other Ambulatory Visit: Payer: Self-pay

## 2020-08-09 ENCOUNTER — Other Ambulatory Visit (HOSPITAL_COMMUNITY): Payer: BC Managed Care – PPO

## 2020-08-10 ENCOUNTER — Ambulatory Visit (INDEPENDENT_AMBULATORY_CARE_PROVIDER_SITE_OTHER): Payer: BC Managed Care – PPO | Admitting: Obstetrics & Gynecology

## 2020-08-10 ENCOUNTER — Encounter: Payer: Self-pay | Admitting: Obstetrics & Gynecology

## 2020-08-10 ENCOUNTER — Other Ambulatory Visit: Payer: Self-pay

## 2020-08-10 VITALS — BP 126/80 | Ht 64.0 in | Wt 221.0 lb

## 2020-08-10 DIAGNOSIS — Z01419 Encounter for gynecological examination (general) (routine) without abnormal findings: Secondary | ICD-10-CM

## 2020-08-10 DIAGNOSIS — E6609 Other obesity due to excess calories: Secondary | ICD-10-CM

## 2020-08-10 DIAGNOSIS — Z6837 Body mass index (BMI) 37.0-37.9, adult: Secondary | ICD-10-CM

## 2020-08-10 DIAGNOSIS — Z3044 Encounter for surveillance of vaginal ring hormonal contraceptive device: Secondary | ICD-10-CM | POA: Diagnosis not present

## 2020-08-10 MED ORDER — ETONOGESTREL-ETHINYL ESTRADIOL 0.12-0.015 MG/24HR VA RING: 1.0000 | VAGINAL_RING | VAGINAL | 4 refills | Status: DC

## 2020-08-10 NOTE — Progress Notes (Signed)
Teresa Liu [birth year] [id number]   History:    45 y.o. G2P2L2 Married.  Works at [employer].  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  Son is 11 yo.   RP:  Established patient presenting for annual gyn exam   HPI: Well on Nuvaring.  No BTB.  No pelvic pain.  No pain with IC.  Urine/BMs normal.  Breasts normal.  BMI 37.93.  Increased physical activity x 1 month with Elliptical x 1 hour every day.  Health labs with Fam MD.   Past medical history,surgical history, family history and social history were all reviewed and documented in the EPIC chart.  Gynecologic History Patient's last menstrual period was 08/03/2020.  Obstetric History OB History  Gravida Para Term Preterm AB Living  2 2       2   SAB IAB Ectopic Multiple Live Births               # Outcome Date GA Lbr Len/2nd Weight Sex Delivery Anes PTL Lv  2 Para           1 Para              ROS: A ROS was performed and pertinent positives and negatives are included in the history.  GENERAL: No fevers or chills. HEENT: No change in vision, no earache, sore throat or sinus congestion. NECK: No pain or stiffness. CARDIOVASCULAR: No chest pain or pressure. No palpitations. PULMONARY: No shortness of breath, cough or wheeze. GASTROINTESTINAL: No abdominal pain, nausea, vomiting or diarrhea, melena or bright red blood per rectum. GENITOURINARY: No urinary frequency, urgency, hesitancy or dysuria. MUSCULOSKELETAL: No joint or muscle pain, no back pain, no recent trauma. DERMATOLOGIC: No rash, no itching, no lesions. ENDOCRINE: No polyuria, polydipsia, no heat or cold intolerance. No recent change in weight. HEMATOLOGICAL: No anemia or easy bruising or bleeding. NEUROLOGIC: No headache, seizures, numbness, tingling or weakness. PSYCHIATRIC: No depression, no loss of interest in normal activity or change in sleep pattern.     Exam:   BP 126/80   Ht 5\' 4"  (1.626 m)   Wt 221 lb (100.2 kg)   LMP 08/03/2020   BMI 37.93 kg/m   Body mass  index is 37.93 kg/m.  General appearance : Well developed well nourished female. No acute distress HEENT: Eyes: no retinal hemorrhage or exudates,  Neck supple, trachea midline, no carotid bruits, no thyroidmegaly Lungs: Clear to auscultation, no rhonchi or wheezes, or rib retractions  Heart: Regular rate and rhythm, no murmurs or gallops Breast:Examined in sitting and supine position were symmetrical in appearance, no palpable masses or tenderness,  no skin retraction, no nipple inversion, no nipple discharge, no skin discoloration, no axillary or supraclavicular lymphadenopathy Abdomen: no palpable masses or tenderness, no rebound or guarding Extremities: no edema or skin discoloration or tenderness  Pelvic: Vulva: Normal             Vagina: No gross lesions or discharge  Cervix: No gross lesions or discharge  Uterus  AV, normal size, shape and consistency, non-tender and mobile  Adnexa  Without masses or tenderness  Anus: Normal   Assessment/Plan:  45 y.o. female for annual exam   1. Well female exam with routine gynecological exam Normal gynecologic exam.  We will repeat a Pap test at 3 years next year.  Breast exam normal.  Screening mammogram August 2021 was negative.  Health labs with family physician.  2. Encounter for surveillance of vaginal ring hormonal contraceptive device Well on  NuvaRing.  No breakthrough bleeding.  No contraindication to continue.  Prescription sent to pharmacy.  3. Class 2 obesity due to excess calories without serious comorbidity with body mass index (BMI) of 37.0 to 37.9 in adult Patient is exercising more x1 month with aerobic activities x1 hour every day.  Recommend light weightlifting every 2 days.  Lower calorie/carb diet.  Other orders - etonogestrel-ethinyl estradiol (NUVARING) 0.12-0.015 MG/24HR vaginal ring; Place 1 each vaginally every 28 (twenty-eight) days.   Princess Bruins MD, 2:37 PM 08/10/2020

## 2020-08-13 ENCOUNTER — Ambulatory Visit (HOSPITAL_COMMUNITY): Payer: PRIVATE HEALTH INSURANCE

## 2020-08-14 ENCOUNTER — Ambulatory Visit (HOSPITAL_COMMUNITY): Payer: PRIVATE HEALTH INSURANCE

## 2020-08-15 ENCOUNTER — Ambulatory Visit (HOSPITAL_COMMUNITY): Payer: PRIVATE HEALTH INSURANCE

## 2020-08-16 ENCOUNTER — Ambulatory Visit (HOSPITAL_COMMUNITY): Payer: PRIVATE HEALTH INSURANCE

## 2020-08-17 ENCOUNTER — Ambulatory Visit (HOSPITAL_COMMUNITY): Payer: PRIVATE HEALTH INSURANCE

## 2020-08-28 ENCOUNTER — Other Ambulatory Visit: Payer: Self-pay | Admitting: Obstetrics & Gynecology

## 2020-08-28 DIAGNOSIS — Z1231 Encounter for screening mammogram for malignant neoplasm of breast: Secondary | ICD-10-CM

## 2020-10-24 ENCOUNTER — Ambulatory Visit
Admission: RE | Admit: 2020-10-24 | Discharge: 2020-10-24 | Disposition: A | Discharge status: Home / Self Care | Payer: 59 | Source: Ambulatory Visit | Attending: Obstetrics & Gynecology | Admitting: Obstetrics & Gynecology

## 2020-10-24 ENCOUNTER — Other Ambulatory Visit: Payer: Self-pay

## 2020-10-24 DIAGNOSIS — Z1231 Encounter for screening mammogram for malignant neoplasm of breast: Secondary | ICD-10-CM

## 2020-10-31 ENCOUNTER — Other Ambulatory Visit: Payer: Self-pay | Admitting: Obstetrics & Gynecology

## 2020-10-31 DIAGNOSIS — R928 Other abnormal and inconclusive findings on diagnostic imaging of breast: Secondary | ICD-10-CM

## 2020-11-19 ENCOUNTER — Other Ambulatory Visit: Payer: Self-pay | Admitting: Obstetrics & Gynecology

## 2020-11-19 ENCOUNTER — Ambulatory Visit: Payer: 59

## 2020-11-19 ENCOUNTER — Other Ambulatory Visit: Payer: Self-pay

## 2020-11-19 ENCOUNTER — Ambulatory Visit
Admission: RE | Admit: 2020-11-19 | Discharge: 2020-11-19 | Disposition: A | Discharge status: Home / Self Care | Payer: 59 | Source: Ambulatory Visit | Attending: Obstetrics & Gynecology | Admitting: Obstetrics & Gynecology

## 2020-11-19 DIAGNOSIS — R928 Other abnormal and inconclusive findings on diagnostic imaging of breast: Secondary | ICD-10-CM

## 2021-04-24 ENCOUNTER — Ambulatory Visit
Admission: RE | Admit: 2021-04-24 | Discharge: 2021-04-24 | Disposition: A | Discharge status: Home / Self Care | Payer: 59 | Source: Ambulatory Visit | Attending: Obstetrics & Gynecology | Admitting: Obstetrics & Gynecology

## 2021-04-24 ENCOUNTER — Other Ambulatory Visit: Payer: Self-pay | Admitting: Obstetrics & Gynecology

## 2021-04-24 DIAGNOSIS — R921 Mammographic calcification found on diagnostic imaging of breast: Secondary | ICD-10-CM

## 2021-04-24 DIAGNOSIS — R928 Other abnormal and inconclusive findings on diagnostic imaging of breast: Secondary | ICD-10-CM

## 2021-05-02 ENCOUNTER — Other Ambulatory Visit: Payer: Self-pay | Admitting: Cardiovascular Disease

## 2021-05-31 ENCOUNTER — Ambulatory Visit
Admission: RE | Admit: 2021-05-31 | Discharge: 2021-05-31 | Disposition: A | Discharge status: Home / Self Care | Payer: 59 | Source: Ambulatory Visit | Attending: Obstetrics & Gynecology | Admitting: Obstetrics & Gynecology

## 2021-08-13 ENCOUNTER — Ambulatory Visit: Payer: BC Managed Care – PPO | Admitting: Obstetrics & Gynecology

## 2021-08-15 ENCOUNTER — Other Ambulatory Visit: Payer: Self-pay | Admitting: Obstetrics & Gynecology

## 2021-08-19 ENCOUNTER — Ambulatory Visit (INDEPENDENT_AMBULATORY_CARE_PROVIDER_SITE_OTHER): Payer: 59 | Admitting: Obstetrics & Gynecology

## 2021-08-19 ENCOUNTER — Encounter: Payer: Self-pay | Admitting: Obstetrics & Gynecology

## 2021-08-19 VITALS — BP 124/84 | HR 80 | Ht 64.0 in | Wt 227.0 lb

## 2021-08-19 DIAGNOSIS — Z6838 Body mass index (BMI) 38.0-38.9, adult: Secondary | ICD-10-CM

## 2021-08-19 DIAGNOSIS — Z3044 Encounter for surveillance of vaginal ring hormonal contraceptive device: Secondary | ICD-10-CM

## 2021-08-19 DIAGNOSIS — Z01419 Encounter for gynecological examination (general) (routine) without abnormal findings: Secondary | ICD-10-CM

## 2021-08-19 DIAGNOSIS — E6609 Other obesity due to excess calories: Secondary | ICD-10-CM

## 2021-08-19 MED ORDER — ETONOGESTREL-ETHINYL ESTRADIOL 0.12-0.015 MG/24HR VA RING: 1.0000 | VAGINAL_RING | VAGINAL | 4 refills | Status: DC

## 2021-08-29 ENCOUNTER — Other Ambulatory Visit: Payer: Self-pay | Admitting: Cardiovascular Disease

## 2021-09-11 ENCOUNTER — Other Ambulatory Visit: Payer: Self-pay | Admitting: Cardiovascular Disease

## 2021-09-29 ENCOUNTER — Encounter: Payer: Self-pay | Admitting: Cardiovascular Disease

## 2021-09-29 NOTE — Progress Notes (Signed)
This encounter was created in error - please disregard.

## 2021-10-01 ENCOUNTER — Encounter: Payer: 59 | Admitting: Cardiovascular Disease

## 2021-10-08 ENCOUNTER — Ambulatory Visit: Payer: 59 | Admitting: Cardiovascular Disease

## 2021-10-17 NOTE — Progress Notes (Signed)
Cardiology Office Note:    Date:  10/18/2021   ID:  Teresa Liu, DOB 02-28-1975, MRN [id number]  PCP:  Kelton Pillar, MD   [facility] Providers Cardiologist:  Mertie Moores, MD     Referring MD: Kelton Pillar, MD   Chief Complaint: annual follow-up hypertension, palpitations  History of Present Illness:    Teresa Liu is a very pleasant 46 y.o. female with a hx of   Hypertension  Diastolic dysfunction by echocardiogram 2017 Sinus tachycardia Meningioma s/p resection in 2012 Psoriasis - on methotrexate    She was last seen in our office on 02/29/2020 by Richardson Dopp, PA.  He reported some palpitations at night when she is lays in bed. No chest pain, dyspnea, orthopnea, syncope, near syncope, or leg edema.  He reported recent normal thyroid tests.  Due to palpitations at night, she was advised to take her Toprol in the evening or to split the dose and take half in the morning and half in the evening. She was advised to return for 1 year follow-up.  Today, she is here alone for evaluation.  She reports she is doing well, no palpitations no chest pain. No palps, no CP,  No treadmill recently   Past Medical History:  Diagnosis Date   Anxiety    Depression    HTN (hypertension) 08/20/2015   Meningioma ([place])    Migraines     Past Surgical History:  Procedure Laterality Date   CRANIOTOMY FOR TUMOR  08/26/2010   right frontal craniotomy, gross total resection of brain tumor    Current Medications: Current Meds  Medication Sig   erythromycin with ethanol (THERAMYCIN) 2 % external solution Apply topically. Apply topically.   etonogestrel-ethinyl estradiol (NUVARING) 0.12-0.015 MG/24HR vaginal ring Place 1 each vaginally every 28 (twenty-eight) days.   Fluocinolone Acetonide 0.01 % OIL Instill 4 drops nightly into ear(s) for 14 days. Then stop and use as needed for itching.   folic acid (FOLVITE) 1 MG tablet TAKE 1 TABLET BY MOUTH EVERY DAY   ibuprofen (ADVIL)  600 MG tablet Take 600 mg by mouth every 6 (six) hours as needed.   methotrexate (RHEUMATREX) 2.5 MG tablet Take 2.5 mg by mouth once a week. Take 6 tablets by mouth weekly   [DISCONTINUED] metoprolol succinate (TOPROL-XL) 100 MG 24 hr tablet TAKE 1 TABLET BY MOUTH DAILY. APPOINMTENT NEEDED FOR FUTURE REFILLS     Allergies:   Patient has no known allergies.   Social History   Socioeconomic History   Marital status: Married    Spouse name: Not on file   Number of children: 2   Years of education: Not on file   Highest education level: Bachelor's degree (e.g., BA, AB, BS)  Occupational History   Not on file  Tobacco Use   Smoking status: Never   Smokeless tobacco: Never  Vaping Use   Vaping Use: Never used  Substance and Sexual Activity   Alcohol use: No   Drug use: No   Sexual activity: Yes    Partners: Male    Birth control/protection: Inserts    Comment: 1st intercourse- 22, partner-1, married- 12 yrs   Other Topics Concern   Not on file  Social History Narrative   Not on file   Social Determinants of Health   Financial Resource Strain: Not on file  Food Insecurity: Not on file  Transportation Needs: Not on file  Physical Activity: Not on file  Stress: Not on file  Social Connections: Not on  file     Family History: The patient's family history includes Cancer in her mother; Colon cancer in her paternal grandmother; Diabetes in her maternal aunt, maternal uncle, paternal aunt, and paternal uncle; Glaucoma in her maternal grandmother; Heart Problems in her father; Hypertension in her father. There is no history of Heart attack.  ROS:   Please see the history of present illness.   All other systems reviewed and are negative.  Labs/Other Studies Reviewed:    The following studies were reviewed today:  Prior CV studies: Echo 03/23/15 EF 01-60, grade 2 diastolic dysfunction   Event monitor 03/23/15 Sinus rhythm  No arrhythmias noted Symptoms of palpitations did  not correlate with arrhythmia.   Recent Labs: No results found for requested labs within last 365 days.  Recent Lipid Panel No results found for: "CHOL", "TRIG", "HDL", "CHOLHDL", "VLDL", "LDLCALC", "LDLDIRECT"   Risk Assessment/Calculations:       Physical Exam:    VS:  BP (!) 136/98   Pulse 77   Ht '5\' 4"'$  (1.626 m)   Wt 228 lb 12.8 oz (103.8 kg)   SpO2 96%   BMI 39.27 kg/m     Wt Readings from Last 3 Encounters:  10/18/21 228 lb 12.8 oz (103.8 kg)  08/19/21 227 lb (103 kg)  08/10/20 221 lb (100.2 kg)     GEN:  Well developed, obese female in no acute distress HEENT: Normal NECK: No JVD; No carotid bruits CARDIAC: RRR, 2/6 systolic murmur RUSB. No rubs, gallops RESPIRATORY:  Clear to auscultation without rales, wheezing or rhonchi  ABDOMEN: Soft, non-tender, non-distended MUSCULOSKELETAL:  No edema; No deformity. 2+ pedal pulses, equal bilaterally SKIN: Warm and dry NEUROLOGIC:  Alert and oriented x 3 PSYCHIATRIC:  Normal affect   EKG:  EKG is ordered today.  The ekg ordered today demonstrates NSR at 77 bpm, minimal criteria for LVH, no ST abnormality  HYPERTENSION CONTROL Vitals:   10/18/21 1513 10/18/21 1554  BP: (!) 134/94 (!) 136/98    The patient's blood pressure is elevated above target today.  In order to address the patient's elevated BP: Blood pressure will be monitored at home to determine if medication changes need to be made.      Diagnoses:    1. Murmur   2. Palpitations   3. Essential hypertension    Assessment and Plan:     Palpitations: Quiescent at this time. She takes Toprol 100 mg in the evenings and has no concerning symptoms.  Hypertension: BP is elevated today, even more so on my recheck.  Grade 2 diastolic dysfunction on echo.  Minimal criteria for LVH on EKG.  Encouraged better BP control.  Thinks she has a home cuff, will go home and check. Advised her to notify us if she does not have one. Encouraged her to monitor daily for 2  weeks and report to [place] if BP consistently > 109 systolic or 80 diastolic. Would favor addition of ARB or amlodipine if BP remains elevated. Encouraged heart healthy, mostly plant based diet, 150 minutes moderate intensity exercise each week.   Murmur: 2/6 systolic murmur right upper sternal border on exam.  Last echocardiogram 2016 revealed trivial mitral regurgitation. She is asymptomatic. We will repeat echocardiogram for evaluation of structural heart disease.     Disposition: 1 year with Dr. Acie Fredrickson  Medication Adjustments/Labs and Tests Ordered: Current medicines are reviewed at length with the patient today.  Concerns regarding medicines are outlined above.  Orders Placed This Encounter  Procedures  EKG 12-Lead   ECHOCARDIOGRAM COMPLETE   Meds ordered this encounter  Medications   metoprolol succinate (TOPROL-XL) 100 MG 24 hr tablet    Sig: Take one (1) tablet by mouth ( 100 mg ) daily.    Dispense:  90 tablet    Refill:  3    Patient Instructions  Medication Instructions:   Your physician recommends that you continue on your current medications as directed. Please refer to the Current Medication list given to you today.   *If you need a refill on your cardiac medications before your next appointment, please call your pharmacy*   Lab Work:  None ordered.  If you have labs (blood work) drawn today and your tests are completely normal, you will receive your results only by: [place] (if you have MyChart) OR A paper copy in the mail If you have any lab test that is abnormal or we need to change your treatment, we will call you to review the results.   Testing/Procedures:   Your physician has requested that you have an echocardiogram. Echocardiography is a painless test that uses sound waves to create images of your heart. It provides your doctor with information about the size and shape of your heart and how well your heart's chambers and valves are working. This  procedure takes approximately one hour. There are no restrictions for this procedure.    Follow-Up: At [facility], you and your health needs are our priority.  As part of our continuing mission to provide you with exceptional heart care, we have created designated Provider Care Teams.  These Care Teams include your primary Cardiologist (physician) and Advanced Practice Providers (APPs -  Physician Assistants and Nurse Practitioners) who all work together to provide you with the care you need, when you need it.  We recommend signing up for the patient portal called "MyChart".  Sign up information is provided on this After Visit Summary.  MyChart is used to connect with patients for Virtual Visits (Telemedicine).  Patients are able to view lab/test results, encounter notes, upcoming appointments, etc.  Non-urgent messages can be sent to your provider as well.   To learn more about what you can do with MyChart, go to [email or web address].    Your next appointment:   1 year(s)  The format for your next appointment:   In Person  Provider:   Mertie Moores, MD     Other Instructions  Your physician wants you to follow-up in: 1 year with Dr. Cathie Olden.  You will receive a reminder letter in the mail two months in advance. If you don't receive a letter, please call our office to schedule the follow-up appointment.  HOW TO TAKE YOUR BLOOD PRESSURE: Rest 5 minutes before taking your blood pressure.  Don't smoke or drink caffeinated beverages for at least 30 minutes before. Take your blood pressure before (not after) you eat. Sit comfortably with your back supported and both feet on the floor (don't cross your legs). Elevate your arm to heart level on a table or a desk. Use the proper sized cuff. It should fit smoothly and snugly around your bare upper arm. There should be enough room to slip a fingertip under the cuff. The bottom edge of the cuff should be 1 inch above the crease of the elbow.   Please monitor your Blood Pressure and if your blood pressure consistently remains above 130/80 X 3 please call office at [phone] or send in [place] message.   Mediterranean Diet  A Mediterranean diet refers to food and lifestyle choices that are based on the traditions of countries located on the [employer]. It focuses on eating more fruits, vegetables, whole grains, beans, nuts, seeds, and heart-healthy fats, and eating less dairy, meat, eggs, and processed foods with added sugar, salt, and fat. This way of eating has been shown to help prevent certain conditions and improve outcomes for people who have chronic diseases, like kidney disease and heart disease. What are tips for following this plan? Reading food labels Check the serving size of packaged foods. For foods such as rice and pasta, the serving size refers to the amount of cooked product, not dry. Check the total fat in packaged foods. Avoid foods that have saturated fat or trans fats. Check the ingredient list for added sugars, such as corn syrup. Shopping  Buy a variety of foods that offer a balanced diet, including: Fresh fruits and vegetables (produce). Grains, beans, nuts, and seeds. Some of these may be available in unpackaged forms or large amounts (in bulk). Fresh seafood. Poultry and eggs. Low-fat dairy products. Buy whole ingredients instead of prepackaged foods. Buy fresh fruits and vegetables in-season from local farmers markets. Buy plain frozen fruits and vegetables. If you do not have access to quality fresh seafood, buy precooked frozen shrimp or canned fish, such as tuna, salmon, or sardines. Stock your pantry so you always have certain foods on hand, such as olive oil, canned tuna, canned tomatoes, rice, pasta, and beans. Cooking Cook foods with extra-virgin olive oil instead of using butter or other vegetable oils. Have meat as a side dish, and have vegetables or grains as your main dish. This means  having meat in small portions or adding small amounts of meat to foods like pasta or stew. Use beans or vegetables instead of meat in common dishes like chili or lasagna. Experiment with different cooking methods. Try roasting, broiling, steaming, and sauting vegetables. Add frozen vegetables to soups, stews, pasta, or rice. Add nuts or seeds for added healthy fats and plant protein at each meal. You can add these to yogurt, salads, or vegetable dishes. Marinate fish or vegetables using olive oil, lemon juice, garlic, and fresh herbs. Meal planning Plan to eat one vegetarian meal one day each week. Try to work up to two vegetarian meals, if possible. Eat seafood two or more times a week. Have healthy snacks readily available, such as: Vegetable sticks with hummus. Greek yogurt. Fruit and nut trail mix. Eat balanced meals throughout the week. This includes: Fruit: 2-3 servings a day. Vegetables: 4-5 servings a day. Low-fat dairy: 2 servings a day. Fish, poultry, or lean meat: 1 serving a day. Beans and legumes: 2 or more servings a week. Nuts and seeds: 1-2 servings a day. Whole grains: 6-8 servings a day. Extra-virgin olive oil: 3-4 servings a day. Limit red meat and sweets to only a few servings a month. Lifestyle  Cook and eat meals together with your family, when possible. Drink enough fluid to keep your urine pale yellow. Be physically active every day. This includes: Aerobic exercise like running or swimming. Leisure activities like gardening, walking, or housework. Get 7-8 hours of sleep each night. If recommended by your health care provider, drink red wine in moderation. This means 1 glass a day for nonpregnant women and 2 glasses a day for men. A glass of wine equals 5 oz (150 mL). What foods should I eat? Fruits Apples. Apricots. Avocado. Berries. Bananas. Cherries. Dates. Figs. Grapes.  Lemons. Melon. Oranges. Peaches. Plums. Pomegranate. Vegetables Artichokes. Beets.  Broccoli. Cabbage. Carrots. Eggplant. Green beans. Chard. Kale. Spinach. Onions. Leeks. Peas. Squash. Tomatoes. Peppers. Radishes. Grains Whole-grain pasta. Brown rice. Bulgur wheat. Polenta. Couscous. Whole-wheat bread. [place]. Meats and other proteins Beans. Almonds. Sunflower seeds. Pine nuts. Peanuts. [place]. Salmon. Scallops. Shrimp. [place]. Tilapia. Clams. Oysters. Eggs. Poultry without skin. Dairy Low-fat milk. Cheese. Greek yogurt. Fats and oils Extra-virgin olive oil. Avocado oil. Grapeseed oil. Beverages Water. Red wine. Herbal tea. Sweets and desserts Greek yogurt with honey. Baked apples. Poached pears. Trail mix. Seasonings and condiments Basil. Cilantro. Coriander. Cumin. Mint. Parsley. Sage. Rosemary. Tarragon. Garlic. Oregano. Thyme. Pepper. Balsamic vinegar. Tahini. Hummus. Tomato sauce. Olives. Mushrooms. The items listed above may not be a complete list of foods and beverages you can eat. Contact a dietitian for more information. What foods should I limit? This is a list of foods that should be eaten rarely or only on special occasions. Fruits Fruit canned in syrup. Vegetables Deep-fried potatoes (french fries). Grains Prepackaged pasta or rice dishes. Prepackaged cereal with added sugar. Prepackaged snacks with added sugar. Meats and other proteins Beef. Pork. Lamb. Poultry with skin. Hot dogs. [place] Salines. Dairy Ice cream. Sour cream. Whole milk. Fats and oils Butter. Canola oil. Vegetable oil. Beef fat (tallow). Lard. Beverages Juice. Sugar-sweetened soft drinks. Beer. Liquor and spirits. Sweets and desserts Cookies. Cakes. Pies. Candy. Seasonings and condiments Mayonnaise. Pre-made sauces and marinades. The items listed above may not be a complete list of foods and beverages you should limit. Contact a dietitian for more information. Summary The Mediterranean diet includes both food and lifestyle choices. Eat a variety of fresh fruits and vegetables, beans,  nuts, seeds, and whole grains. Limit the amount of red meat and sweets that you eat. If recommended by your health care provider, drink red wine in moderation. This means 1 glass a day for nonpregnant women and 2 glasses a day for men. A glass of wine equals 5 oz (150 mL). This information is not intended to replace advice given to you by your health care provider. Make sure you discuss any questions you have with your health care provider. Document Revised: 03/18/2019 Document Reviewed: 01/13/2019 Elsevier Patient Education  [place]         Signed, Emmaline Life, NP  10/18/2021 3:54 PM    Circle

## 2021-10-18 ENCOUNTER — Ambulatory Visit: Payer: 59 | Admitting: Nurse Practitioner

## 2021-10-18 ENCOUNTER — Encounter: Payer: Self-pay | Admitting: Nurse Practitioner

## 2021-10-18 VITALS — BP 136/98 | HR 77 | Ht 64.0 in | Wt 228.8 lb

## 2021-10-18 DIAGNOSIS — R011 Cardiac murmur, unspecified: Secondary | ICD-10-CM | POA: Diagnosis not present

## 2021-10-18 DIAGNOSIS — I10 Essential (primary) hypertension: Secondary | ICD-10-CM | POA: Diagnosis not present

## 2021-10-18 DIAGNOSIS — R002 Palpitations: Secondary | ICD-10-CM

## 2021-10-18 MED ORDER — METOPROLOL SUCCINATE ER 100 MG PO TB24: ORAL_TABLET | ORAL | 3 refills | Status: DC

## 2021-10-18 NOTE — Patient Instructions (Signed)
Medication Instructions:   Your physician recommends that you continue on your current medications as directed. Please refer to the Current Medication list given to you today.   *If you need a refill on your cardiac medications before your next appointment, please call your pharmacy*   Lab Work:  None ordered.  If you have labs (blood work) drawn today and your tests are completely normal, you will receive your results only by: [place] (if you have MyChart) OR A paper copy in the mail If you have any lab test that is abnormal or we need to change your treatment, we will call you to review the results.   Testing/Procedures:   Your physician has requested that you have an echocardiogram. Echocardiography is a painless test that uses sound waves to create images of your heart. It provides your doctor with information about the size and shape of your heart and how well your heart's chambers and valves are working. This procedure takes approximately one hour. There are no restrictions for this procedure.    Follow-Up: At [facility], you and your health needs are our priority.  As part of our continuing mission to provide you with exceptional heart care, we have created designated Provider Care Teams.  These Care Teams include your primary Cardiologist (physician) and Advanced Practice Providers (APPs -  Physician Assistants and Nurse Practitioners) who all work together to provide you with the care you need, when you need it.  We recommend signing up for the patient portal called "MyChart".  Sign up information is provided on this After Visit Summary.  MyChart is used to connect with patients for Virtual Visits (Telemedicine).  Patients are able to view lab/test results, encounter notes, upcoming appointments, etc.  Non-urgent messages can be sent to your provider as well.   To learn more about what you can do with MyChart, go to [email or web address].    Your next  appointment:   1 year(s)  The format for your next appointment:   In Person  Provider:   Mertie Moores, MD     Other Instructions  Your physician wants you to follow-up in: 1 year with Dr. Cathie Olden.  You will receive a reminder letter in the mail two months in advance. If you don't receive a letter, please call our office to schedule the follow-up appointment.  HOW TO TAKE YOUR BLOOD PRESSURE: Rest 5 minutes before taking your blood pressure.  Don't smoke or drink caffeinated beverages for at least 30 minutes before. Take your blood pressure before (not after) you eat. Sit comfortably with your back supported and both feet on the floor (don't cross your legs). Elevate your arm to heart level on a table or a desk. Use the proper sized cuff. It should fit smoothly and snugly around your bare upper arm. There should be enough room to slip a fingertip under the cuff. The bottom edge of the cuff should be 1 inch above the crease of the elbow.  Please monitor your Blood Pressure and if your blood pressure consistently remains above 130/80 X 3 please call office at [phone] or send in [place] message.   Mediterranean Diet A Mediterranean diet refers to food and lifestyle choices that are based on the traditions of countries located on the [employer]. It focuses on eating more fruits, vegetables, whole grains, beans, nuts, seeds, and heart-healthy fats, and eating less dairy, meat, eggs, and processed foods with added sugar, salt, and fat. This way of eating has been shown  to help prevent certain conditions and improve outcomes for people who have chronic diseases, like kidney disease and heart disease. What are tips for following this plan? Reading food labels Check the serving size of packaged foods. For foods such as rice and pasta, the serving size refers to the amount of cooked product, not dry. Check the total fat in packaged foods. Avoid foods that have saturated fat or trans  fats. Check the ingredient list for added sugars, such as corn syrup. Shopping  Buy a variety of foods that offer a balanced diet, including: Fresh fruits and vegetables (produce). Grains, beans, nuts, and seeds. Some of these may be available in unpackaged forms or large amounts (in bulk). Fresh seafood. Poultry and eggs. Low-fat dairy products. Buy whole ingredients instead of prepackaged foods. Buy fresh fruits and vegetables in-season from local farmers markets. Buy plain frozen fruits and vegetables. If you do not have access to quality fresh seafood, buy precooked frozen shrimp or canned fish, such as tuna, salmon, or sardines. Stock your pantry so you always have certain foods on hand, such as olive oil, canned tuna, canned tomatoes, rice, pasta, and beans. Cooking Cook foods with extra-virgin olive oil instead of using butter or other vegetable oils. Have meat as a side dish, and have vegetables or grains as your main dish. This means having meat in small portions or adding small amounts of meat to foods like pasta or stew. Use beans or vegetables instead of meat in common dishes like chili or lasagna. Experiment with different cooking methods. Try roasting, broiling, steaming, and sauting vegetables. Add frozen vegetables to soups, stews, pasta, or rice. Add nuts or seeds for added healthy fats and plant protein at each meal. You can add these to yogurt, salads, or vegetable dishes. Marinate fish or vegetables using olive oil, lemon juice, garlic, and fresh herbs. Meal planning Plan to eat one vegetarian meal one day each week. Try to work up to two vegetarian meals, if possible. Eat seafood two or more times a week. Have healthy snacks readily available, such as: Vegetable sticks with hummus. Greek yogurt. Fruit and nut trail mix. Eat balanced meals throughout the week. This includes: Fruit: 2-3 servings a day. Vegetables: 4-5 servings a day. Low-fat dairy: 2 servings a  day. Fish, poultry, or lean meat: 1 serving a day. Beans and legumes: 2 or more servings a week. Nuts and seeds: 1-2 servings a day. Whole grains: 6-8 servings a day. Extra-virgin olive oil: 3-4 servings a day. Limit red meat and sweets to only a few servings a month. Lifestyle  Cook and eat meals together with your family, when possible. Drink enough fluid to keep your urine pale yellow. Be physically active every day. This includes: Aerobic exercise like running or swimming. Leisure activities like gardening, walking, or housework. Get 7-8 hours of sleep each night. If recommended by your health care provider, drink red wine in moderation. This means 1 glass a day for nonpregnant women and 2 glasses a day for men. A glass of wine equals 5 oz (150 mL). What foods should I eat? Fruits Apples. Apricots. Avocado. Berries. Bananas. Cherries. Dates. Figs. Grapes. Lemons. Melon. Oranges. Peaches. Plums. Pomegranate. Vegetables Artichokes. Beets. Broccoli. Cabbage. Carrots. Eggplant. Green beans. Chard. Kale. Spinach. Onions. Leeks. Peas. Squash. Tomatoes. Peppers. Radishes. Grains Whole-grain pasta. Brown rice. Bulgur wheat. Polenta. Couscous. Whole-wheat bread. [place]. Meats and other proteins Beans. Almonds. Sunflower seeds. Pine nuts. Peanuts. [place]. Salmon. Scallops. Shrimp. [place]. Tilapia. Clams. Oysters. Eggs.  Poultry without skin. Dairy Low-fat milk. Cheese. Greek yogurt. Fats and oils Extra-virgin olive oil. Avocado oil. Grapeseed oil. Beverages Water. Red wine. Herbal tea. Sweets and desserts Greek yogurt with honey. Baked apples. Poached pears. Trail mix. Seasonings and condiments Basil. Cilantro. Coriander. Cumin. Mint. Parsley. Sage. Rosemary. Tarragon. Garlic. Oregano. Thyme. Pepper. Balsamic vinegar. Tahini. Hummus. Tomato sauce. Olives. Mushrooms. The items listed above may not be a complete list of foods and beverages you can eat. Contact a dietitian for more  information. What foods should I limit? This is a list of foods that should be eaten rarely or only on special occasions. Fruits Fruit canned in syrup. Vegetables Deep-fried potatoes (french fries). Grains Prepackaged pasta or rice dishes. Prepackaged cereal with added sugar. Prepackaged snacks with added sugar. Meats and other proteins Beef. Pork. Lamb. Poultry with skin. Hot dogs. [place] Salines. Dairy Ice cream. Sour cream. Whole milk. Fats and oils Butter. Canola oil. Vegetable oil. Beef fat (tallow). Lard. Beverages Juice. Sugar-sweetened soft drinks. Beer. Liquor and spirits. Sweets and desserts Cookies. Cakes. Pies. Candy. Seasonings and condiments Mayonnaise. Pre-made sauces and marinades. The items listed above may not be a complete list of foods and beverages you should limit. Contact a dietitian for more information. Summary The Mediterranean diet includes both food and lifestyle choices. Eat a variety of fresh fruits and vegetables, beans, nuts, seeds, and whole grains. Limit the amount of red meat and sweets that you eat. If recommended by your health care provider, drink red wine in moderation. This means 1 glass a day for nonpregnant women and 2 glasses a day for men. A glass of wine equals 5 oz (150 mL). This information is not intended to replace advice given to you by your health care provider. Make sure you discuss any questions you have with your health care provider. Document Revised: 03/18/2019 Document Reviewed: 01/13/2019 Elsevier Patient Education  [place]

## 2021-10-23 ENCOUNTER — Other Ambulatory Visit: Payer: Self-pay | Admitting: Obstetrics & Gynecology

## 2021-10-23 DIAGNOSIS — R921 Mammographic calcification found on diagnostic imaging of breast: Secondary | ICD-10-CM

## 2021-11-04 ENCOUNTER — Ambulatory Visit
Admission: RE | Admit: 2021-11-04 | Discharge: 2021-11-04 | Disposition: A | Discharge status: Home / Self Care | Payer: 59 | Source: Ambulatory Visit | Attending: Obstetrics & Gynecology | Admitting: Obstetrics & Gynecology

## 2021-11-04 ENCOUNTER — Other Ambulatory Visit: Payer: Self-pay | Admitting: Obstetrics & Gynecology

## 2021-11-04 DIAGNOSIS — R921 Mammographic calcification found on diagnostic imaging of breast: Secondary | ICD-10-CM

## 2021-11-05 ENCOUNTER — Ambulatory Visit (HOSPITAL_COMMUNITY): Payer: 59 | Attending: Cardiology

## 2021-11-05 DIAGNOSIS — I10 Essential (primary) hypertension: Secondary | ICD-10-CM | POA: Insufficient documentation

## 2021-11-05 DIAGNOSIS — R002 Palpitations: Secondary | ICD-10-CM | POA: Diagnosis not present

## 2021-11-05 DIAGNOSIS — R011 Cardiac murmur, unspecified: Secondary | ICD-10-CM | POA: Diagnosis not present

## 2021-11-05 LAB — ECHOCARDIOGRAM COMPLETE
Area-P 1/2: 3.66 cm2
S' Lateral: 2.6 cm

## 2022-06-01 IMAGING — MG MM DIGITAL DIAGNOSTIC UNILAT*R* W/ TOMO W/ CAD
7 series · 8 of 15 positions shown · non-contrast
Comparison: Previous exam(s).

CLINICAL DATA: Six-month follow-up of right breast calcifications.

EXAM:
DIGITAL DIAGNOSTIC UNILATERAL RIGHT MAMMOGRAM WITH TOMOSYNTHESIS AND
CAD
TECHNIQUE: Right digital diagnostic mammography and breast tomosynthesis was
performed. The images were evaluated with computer-aided detection.

[R CC]
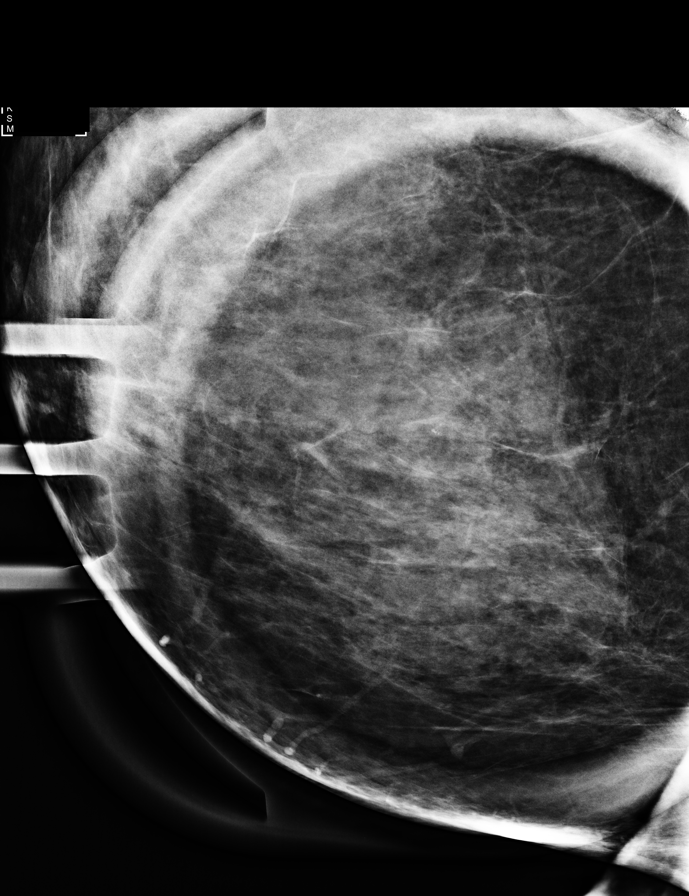

[R ML (1 of 2)]
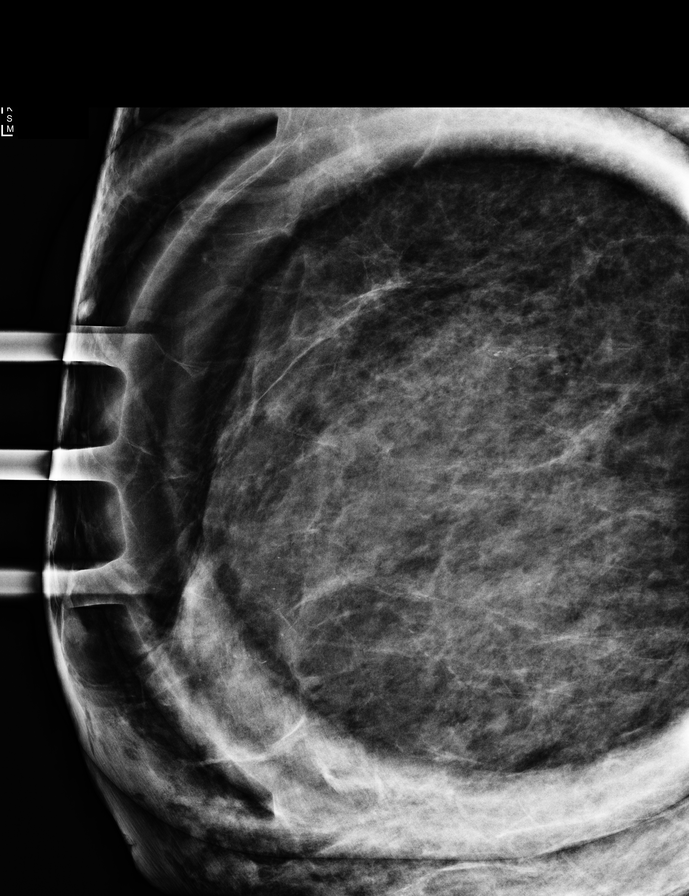

[R ML (2 of 2)]
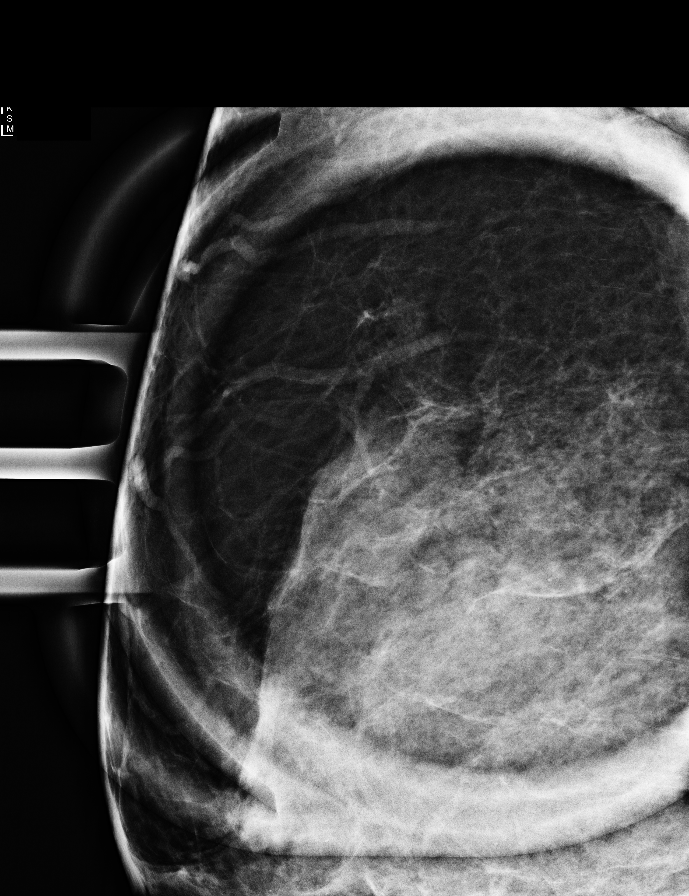

[R MLO synth-2D]
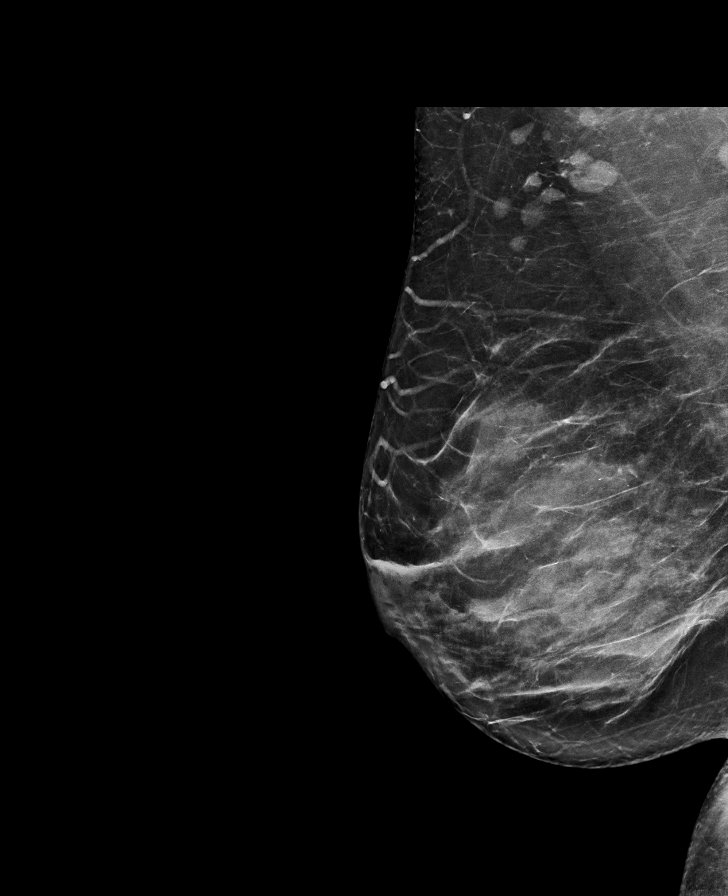

[R CC synth-2D]
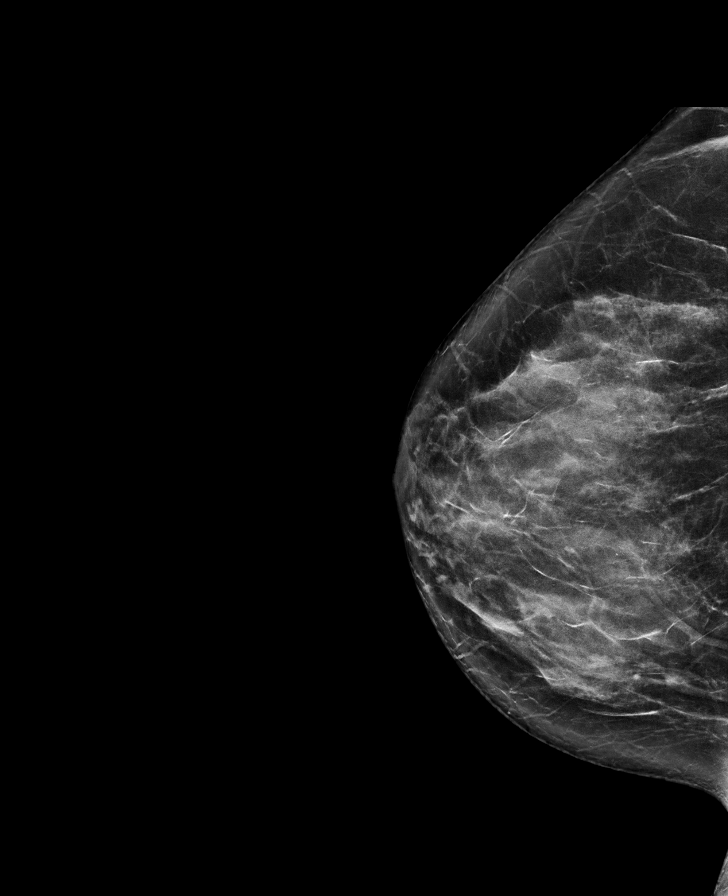

[R MLO tomo · 2 of 83 frames shown]
[frame 27/83]
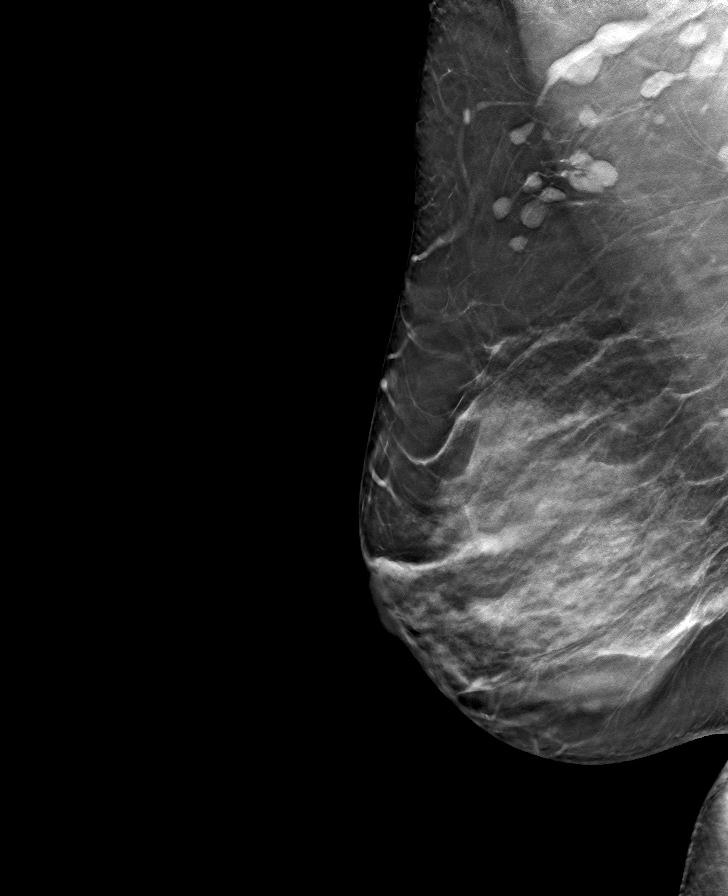
[frame 42/83]
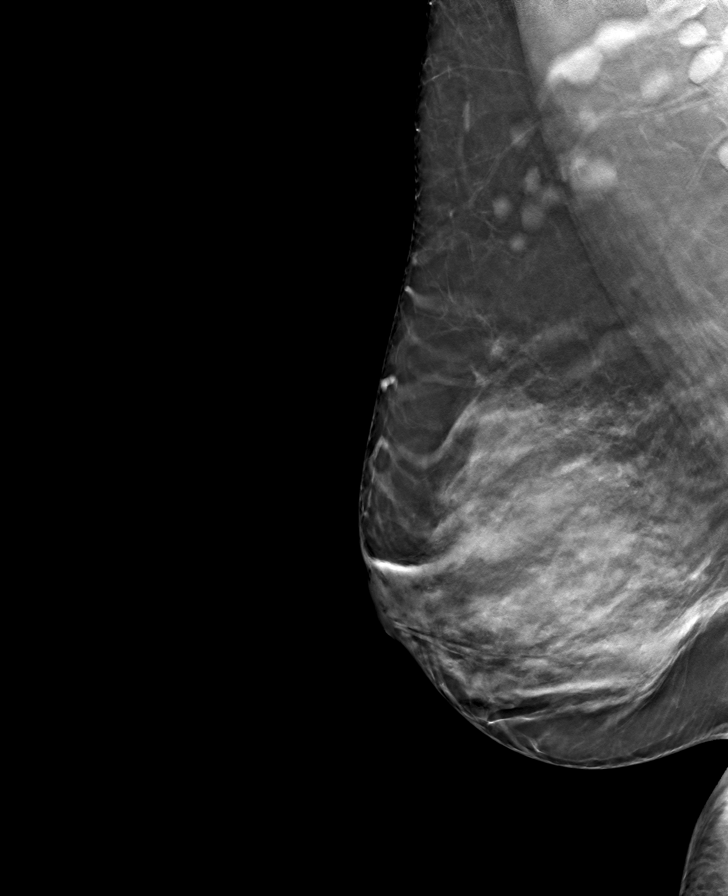

[R CC tomo · tomo slice 39/78.0]
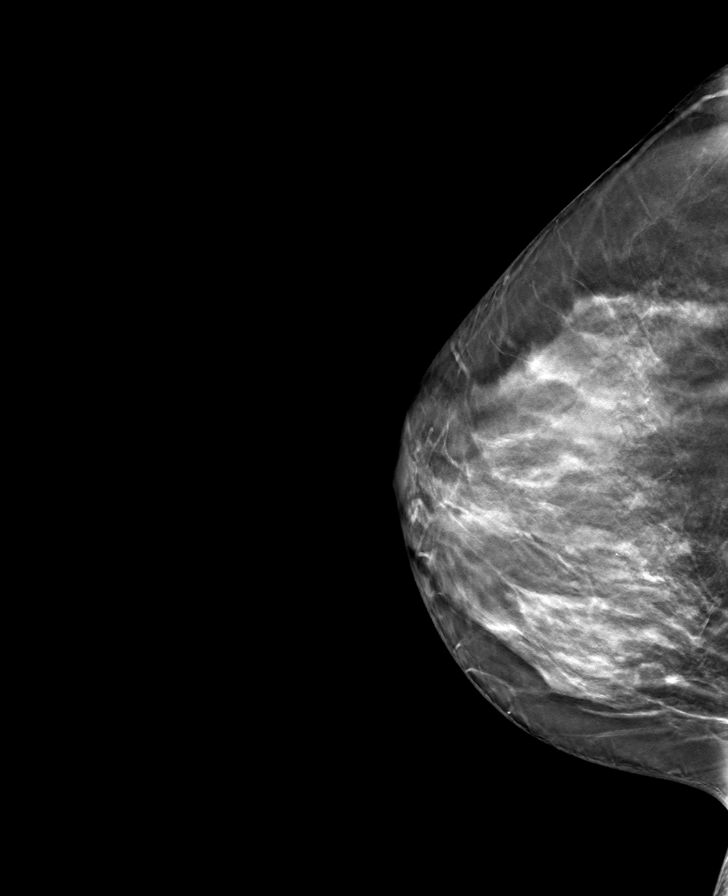

[8 of 15 positions shown; findings below may reference images not displayed]

ACR Breast Density Category c: The breast tissue is heterogeneously
dense, which may obscure small masses.
FINDINGS: The calcifications in the slightly inner central right breast are
stable mammographically. No new or suspicious findings identified in
the right breast.
IMPRESSION: Stable probably benign right breast calcifications as above.

RECOMMENDATION:
The patient is due for bilateral mammography in September 2021.
Recommend magnified views of the probably benign right breast
calcifications at that time.

I have discussed the findings and recommendations with the patient.
If applicable, a reminder letter will be sent to the patient
regarding the next appointment.

BI-RADS CATEGORY  3: Probably benign.

## 2022-08-21 ENCOUNTER — Ambulatory Visit: Payer: 59 | Admitting: Obstetrics & Gynecology

## 2022-08-21 ENCOUNTER — Other Ambulatory Visit (HOSPITAL_COMMUNITY)
Admission: RE | Admit: 2022-08-21 | Discharge: 2022-08-21 | Disposition: A | Discharge status: Home / Self Care | Payer: 59 | Source: Ambulatory Visit | Attending: Obstetrics & Gynecology | Admitting: Obstetrics & Gynecology

## 2022-08-21 ENCOUNTER — Encounter: Payer: Self-pay | Admitting: Obstetrics & Gynecology

## 2022-08-21 VITALS — BP 108/70 | HR 73 | Ht 63.75 in | Wt 227.0 lb

## 2022-08-21 DIAGNOSIS — Z01419 Encounter for gynecological examination (general) (routine) without abnormal findings: Secondary | ICD-10-CM

## 2022-08-21 DIAGNOSIS — Z3044 Encounter for surveillance of vaginal ring hormonal contraceptive device: Secondary | ICD-10-CM

## 2022-08-21 MED ORDER — ETONOGESTREL-ETHINYL ESTRADIOL 0.12-0.015 MG/24HR VA RING: 1.0000 | VAGINAL_RING | VAGINAL | 4 refills | Status: DC

## 2022-08-21 NOTE — Progress Notes (Signed)
Teresa Liu November 30, 1975 [id number]   History:    47 y.o. G2P2L2 Married.  Works at [employer]. Daughter graduated from [employer].  Son is 63 yo.   RP:  Established patient presenting for annual gyn exam   HPI: Well on Nuvaring.  No BTB.  No pelvic pain.  No pain with IC.  Pap 07/2018 Neg. Pap reflex today. Urine/BMs normal. Breasts normal.  Bilateral Dx Mammo/Lt Breast US Probably benign 10/2021.  BMI 39.27. Needs to increase fitness activities.  Health labs with Fam MD. Recommend Colono at 47 yo.   Past medical history,surgical history, family history and social history were all reviewed and documented in the EPIC chart.  Gynecologic History Patient's last menstrual period was 08/12/2022 (exact date).  Obstetric History OB History  Gravida Para Term Preterm AB Living  2 2 1 1   2   SAB IAB Ectopic Multiple Live Births               # Outcome Date GA Lbr Len/2nd Weight Sex Delivery Anes PTL Lv  2 Preterm           1 Term              ROS: A ROS was performed and pertinent positives and negatives are included in the history. GENERAL: No fevers or chills. HEENT: No change in vision, no earache, sore throat or sinus congestion. NECK: No pain or stiffness. CARDIOVASCULAR: No chest pain or pressure. No palpitations. PULMONARY: No shortness of breath, cough or wheeze. GASTROINTESTINAL: No abdominal pain, nausea, vomiting or diarrhea, melena or bright red blood per rectum. GENITOURINARY: No urinary frequency, urgency, hesitancy or dysuria. MUSCULOSKELETAL: No joint or muscle pain, no back pain, no recent trauma. DERMATOLOGIC: No rash, no itching, no lesions. ENDOCRINE: No polyuria, polydipsia, no heat or cold intolerance. No recent change in weight. HEMATOLOGICAL: No anemia or easy bruising or bleeding. NEUROLOGIC: No headache, seizures, numbness, tingling or weakness. PSYCHIATRIC: No depression, no loss of interest in normal activity or change in sleep pattern.     Exam:   BP  108/70   Pulse 73   Ht 5' 3.75" (1.619 m)   Wt 227 lb (103 kg)   LMP 08/12/2022 (Exact Date) Comment: sexually active, nuvaring  SpO2 97%   BMI 39.27 kg/m   Body mass index is 39.27 kg/m.  General appearance : Well developed well nourished female. No acute distress HEENT: Eyes: no retinal hemorrhage or exudates,  Neck supple, trachea midline, no carotid bruits, no thyroidmegaly Lungs: Clear to auscultation, no rhonchi or wheezes, or rib retractions  Heart: Regular rate and rhythm, no murmurs or gallops Breast:Examined in sitting and supine position were symmetrical in appearance, no palpable masses or tenderness,  no skin retraction, no nipple inversion, no nipple discharge, no skin discoloration, no axillary or supraclavicular lymphadenopathy Abdomen: no palpable masses or tenderness, no rebound or guarding Extremities: no edema or skin discoloration or tenderness  Pelvic: Vulva: Normal             Vagina: No gross lesions or discharge  Cervix: No gross lesions or discharge.  Pap reflex done.  Uterus  AV, normal size, shape and consistency, non-tender and mobile  Adnexa  Without masses or tenderness  Anus: Normal   Assessment/Plan:  47 y.o. female for annual exam   1. Encounter for routine gynecological examination with Papanicolaou smear of cervix Well on Nuvaring.  No BTB.  No pelvic pain.  No pain with IC.  Pap 07/2018 Neg. Pap reflex today. Urine/BMs normal. Breasts normal.  Bilateral Dx Mammo/Lt Breast US Probably benign 10/2021.  BMI 39.27. Needs to increase fitness activities.  Health labs with Fam MD. Recommend Colono at 47 yo. - Cytology - PAP( [place])  2. Encounter for surveillance of vaginal ring hormonal contraceptive device Well on Nuvaring.  No BTB.  No pelvic pain.  No pain with IC. No CI to continue on the Nuvaring, prescription sent to pharmacy.  Other orders - etonogestrel-ethinyl estradiol (NUVARING) 0.12-0.015 MG/24HR vaginal ring; Place 1 each vaginally  every 28 (twenty-eight) days.   Genia Del MD, 3:38 PM

## 2022-08-25 LAB — CYTOLOGY - PAP: Diagnosis: NEGATIVE

## 2022-10-17 ENCOUNTER — Other Ambulatory Visit: Payer: Self-pay | Admitting: Internal Medicine

## 2022-10-17 DIAGNOSIS — R921 Mammographic calcification found on diagnostic imaging of breast: Secondary | ICD-10-CM

## 2022-11-01 ENCOUNTER — Other Ambulatory Visit: Payer: Self-pay | Admitting: Nurse Practitioner

## 2022-11-11 ENCOUNTER — Ambulatory Visit
Admission: RE | Admit: 2022-11-11 | Discharge: 2022-11-11 | Disposition: A | Discharge status: Home / Self Care | Payer: 59 | Source: Ambulatory Visit | Attending: Internal Medicine

## 2022-11-11 DIAGNOSIS — R921 Mammographic calcification found on diagnostic imaging of breast: Secondary | ICD-10-CM

## 2022-12-06 ENCOUNTER — Other Ambulatory Visit: Payer: Self-pay | Admitting: Cardiovascular Disease

## 2023-02-03 ENCOUNTER — Other Ambulatory Visit: Payer: Self-pay | Admitting: Cardiovascular Disease

## 2023-02-22 ENCOUNTER — Other Ambulatory Visit: Payer: Self-pay | Admitting: Cardiovascular Disease

## 2023-04-06 ENCOUNTER — Other Ambulatory Visit: Payer: Self-pay | Admitting: Cardiovascular Disease

## 2023-04-16 ENCOUNTER — Other Ambulatory Visit: Payer: Self-pay | Admitting: Cardiovascular Disease

## 2023-04-29 ENCOUNTER — Other Ambulatory Visit: Payer: Self-pay | Admitting: Cardiovascular Disease

## 2023-05-31 ENCOUNTER — Encounter: Payer: Self-pay | Admitting: Cardiovascular Disease

## 2023-05-31 NOTE — Progress Notes (Unsigned)
 Cardiology Office Note   Date:  06/01/2023   ID:  Teresa Liu, DOB 04/04/1975, MRN [id number]  PCP:  [facility]  Cardiologist:   Kristeen Miss, MD   Chief Complaint  Patient presents with   Hypertension        Palpitations      History of Present Illness: Teresa Liu is a 48 y.o. female who presents for evaluation of palpitations and shortness of breath .  She has noticed a rapid HR for the past several years.  Went to urgent care.  Was told that she had anxiety Went to her primary md -  Reported 2 days of not sleeping well due to palps Was having constant fast HR  - would last for days Has resolved now.   Not on any new meds.  Eating and drinking normally. No regular exercise  Echo :   Left ventricle:  The cavity size was normal. Systolic function was normal. The estimated ejection fraction was in the range of 60% to 65%. Features are consistent with a pseudonormal left ventricular filling pattern, with concomitant abnormal relaxation and increased filling pressure (grade 2 diastolic dysfunction).   Works Clinical biochemist.   No CP. Has occasional episodes when she cant get enough air in. No recent weight gain recently .  Drinks some caffiene - 3 sodas a week.  No coffee or tea Does not restrict her diet,  Cooks at home .   Records from [facility] [place] were reviewed.   August 20, 2015:  Tolerating The metoprolol fairly well. She's not having as many palpitations. Her blood pressure was okay during her last visit. It is a little elevated here today.  Jan. 8, 2018:  Doing well ,   Is working on her diet.  Not getting as much exercise as she should   June 01, 2023 Teresa Liu is seen for follow up of her  palpitations and HTN I last saw her 7 years ago  She has been seen periodically by APPS Administrator, arts and Alben Spittle)   Is exercising   Works for social services     Past Medical History:  Diagnosis Date   Anxiety    Depression     HTN (hypertension) 08/20/2015   Meningioma (HCC)    Migraines     Past Surgical History:  Procedure Laterality Date   CRANIOTOMY FOR TUMOR  08/26/2010   right frontal craniotomy, gross total resection of brain tumor     Current Outpatient Medications  Medication Sig Dispense Refill   etonogestrel-ethinyl estradiol (NUVARING) 0.12-0.015 MG/24HR vaginal ring Place 1 each vaginally every 28 (twenty-eight) days. 3 each 4   Fluocinolone Acetonide 0.01 % OIL Instill 4 drops nightly into ear(s) for 14 days. Then stop and use as needed for itching.     folic acid (FOLVITE) 1 MG tablet TAKE 1 TABLET BY MOUTH EVERY DAY     methotrexate (RHEUMATREX) 2.5 MG tablet Take 2.5 mg by mouth once a week. Take 4 tablets by mouth weekly     ibuprofen (ADVIL) 600 MG tablet Take 600 mg by mouth every 6 (six) hours as needed.     metoprolol succinate (TOPROL-XL) 100 MG 24 hr tablet Take 1 tablet (100 mg total) by mouth daily. 90 tablet 3   No current facility-administered medications for this visit.    Allergies:   Patient has no known allergies.    Social History:  The patient  reports that she has never smoked. She has never used  smokeless tobacco. She reports that she does not drink alcohol and does not use drugs.   Family History:  The patient's family history includes Cancer in her mother; Colon cancer in her paternal grandmother; Diabetes in her maternal aunt, maternal uncle, paternal aunt, and paternal uncle; Glaucoma in her maternal grandmother; Heart Problems in her father; Hypertension in her father.    ROS:  Please see the history of present illness.      All other systems are reviewed and negative.    PHYSICAL EXAM: VS:  BP 118/74 (BP Location: Left Arm, Patient Position: Sitting, Cuff Size: Large)   Pulse 67   Ht 5' 3.75" (1.619 m)   Wt 212 lb (96.2 kg)   SpO2 99%   BMI 36.68 kg/m  , BMI Body mass index is 36.68 kg/m. GEN: Well nourished, well developed, in no acute distress  HEENT:  normal  Neck: no JVD, carotid bruits, or masses Cardiac: RRR; no murmurs, rubs, or gallops,no edema  Respiratory:  clear to auscultation bilaterally, normal work of breathing GI: soft, nontender, nondistended, + BS MS: no deformity or atrophy  Skin: warm and dry, no rash Neuro:  Strength and sensation are intact Psych: normal   EKG:     EKG Interpretation Date/Time:  Monday June 01 2023 16:02:27 EDT Ventricular Rate:  67 PR Interval:  140 QRS Duration:  82 QT Interval:  432 QTC Calculation: 456 R Axis:   65  Text Interpretation: Normal sinus rhythm Normal ECG Confirmed by Kristeen Miss ([id number]) on 06/01/2023 4:15:23 PM      Recent Labs: No results found for requested labs within last 365 days.    Lipid Panel No results found for: "CHOL", "TRIG", "HDL", "CHOLHDL", "VLDL", "LDLCALC", "LDLDIRECT"    Wt Readings from Last 3 Encounters:  06/01/23 212 lb (96.2 kg)  08/21/22 227 lb (103 kg)  10/18/21 228 lb 12.8 oz (103.8 kg)      Other studies Reviewed: Additional studies/ records that were reviewed today include: . Review of the above records demonstrates:    ASSESSMENT AND PLAN:  1.  Sinus tachycardia:  Stable on toprol XL 100 mg a day   2. Essential hypertension: BP is very well controlled.  Recommended weight loss.  More exercise     Labs/ tests ordered today include:   Orders Placed This Encounter  Procedures   EKG 12-Lead    Disposition:   1 year      Kristeen Miss, MD  06/01/2023 4:18 PM    [facility] [address] [place], [place], [place]  [address] Phone: [phone]; Fax: [phone]

## 2023-06-01 ENCOUNTER — Ambulatory Visit: Payer: 59 | Attending: Cardiovascular Disease | Admitting: Cardiovascular Disease

## 2023-06-01 ENCOUNTER — Encounter: Payer: Self-pay | Admitting: Cardiovascular Disease

## 2023-06-01 VITALS — BP 118/74 | HR 67 | Ht 63.75 in | Wt 212.0 lb

## 2023-06-01 DIAGNOSIS — I10 Essential (primary) hypertension: Secondary | ICD-10-CM

## 2023-06-01 DIAGNOSIS — R011 Cardiac murmur, unspecified: Secondary | ICD-10-CM | POA: Diagnosis not present

## 2023-06-01 DIAGNOSIS — R002 Palpitations: Secondary | ICD-10-CM

## 2023-06-01 MED ORDER — METOPROLOL SUCCINATE ER 100 MG PO TB24: 100.0000 mg | ORAL_TABLET | Freq: Every day | ORAL | 3 refills | Status: AC

## 2023-06-01 NOTE — Patient Instructions (Signed)
 Medication Instructions:  Your physician recommends that you continue on your current medications as directed. Please refer to the Current Medication list given to you today.  *If you need a refill on your cardiac medications before your next appointment, please call your pharmacy*  Lab Work: NONE  If you have labs (blood work) drawn today and your tests are completely normal, you will receive your results only by: MyChart Message (if you have MyChart) OR A paper copy in the mail If you have any lab test that is abnormal or we need to change your treatment, we will call you to review the results.  Testing/Procedures: NONE  Follow-Up: At [facility], you and your health needs are our priority.  As part of our continuing mission to provide you with exceptional heart care, our providers are all part of one team.  This team includes your primary Cardiologist (physician) and Advanced Practice Providers or APPs (Physician Assistants and Nurse Practitioners) who all work together to provide you with the care you need, when you need it.  Your next appointment:   12 month(s)  Provider:   Kristeen Miss, MD  will be retiring soon you will see a new Provider at your next office visit.      Other Instructions       1st Floor: - Lobby - Registration  - Pharmacy  - Lab - Cafe  2nd Floor: - PV Lab - Diagnostic Testing (echo, CT, nuclear med)  3rd Floor: - Vacant  4th Floor: - TCTS (cardiothoracic surgery) - AFib Clinic - Structural Heart Clinic - Vascular Surgery  - Vascular Ultrasound  5th Floor: - HeartCare Cardiology (general and EP) - Clinical Pharmacy for coumadin, hypertension, lipid, weight-loss medications, and med management appointments    Valet parking services will be available as well.

## 2023-08-26 NOTE — Progress Notes (Incomplete)
 48 y.o. [id number] female here for annual exam. Married.  No LMP recorded.    She reports ***.  Abnormal bleeding: *** Pelvic discharge or pain: *** Breast mass, nipple discharge or skin changes : ***  Sexually active: *** Birth control: NuvaRing  insert Last PAP:     Component Value Date/Time   DIAGPAP  08/21/2022 1554    - Negative for intraepithelial lesion or malignancy (NILM)   ADEQPAP  08/21/2022 1554    Satisfactory for evaluation; transformation zone component PRESENT.   Last mammogram: 11/11/22 [facility]ads 3 Last colonoscopy: *** ***  Exercising: *** Smoker: ***       GYN HISTORY: ***  OB History  Gravida Para Term Preterm AB Living  2 2 1 1  2   SAB IAB Ectopic Multiple Live Births          # Outcome Date GA Lbr Len/2nd Weight Sex Type Anes PTL Lv  2 Preterm           1 Term            Past Medical History:  Diagnosis Date   Anxiety    Depression    HTN (hypertension) 08/20/2015   Meningioma (HCC)    Migraines    Past Surgical History:  Procedure Laterality Date   CRANIOTOMY FOR TUMOR  08/26/2010   right frontal craniotomy, gross total resection of brain tumor   Current Outpatient Medications on File Prior to Visit  Medication Sig Dispense Refill   etonogestrel -ethinyl estradiol  (NUVARING ) 0.12-0.015 MG/24HR vaginal ring Place 1 each vaginally every 28 (twenty-eight) days. 3 each 4   Fluocinolone Acetonide 0.01 % OIL Instill 4 drops nightly into ear(s) for 14 days. Then stop and use as needed for itching.     folic acid (FOLVITE) 1 MG tablet TAKE 1 TABLET BY MOUTH EVERY DAY     ibuprofen (ADVIL) 600 MG tablet Take 600 mg by mouth every 6 (six) hours as needed.     methotrexate (RHEUMATREX) 2.5 MG tablet Take 2.5 mg by mouth once a week. Take 4 tablets by mouth weekly     metoprolol  succinate (TOPROL -XL) 100 MG 24 hr tablet Take 1 tablet (100 mg total) by mouth daily. 90 tablet 3   No current facility-administered medications on file prior to visit.    Social History   Socioeconomic History   Marital status: Married    Spouse name: Not on file   Number of children: 2   Years of education: Not on file   Highest education level: Bachelor's degree (e.g., BA, AB, BS)  Occupational History   Not on file  Tobacco Use   Smoking status: Never   Smokeless tobacco: Never  Vaping Use   Vaping status: Never Used  Substance and Sexual Activity   Alcohol use: No   Drug use: No   Sexual activity: Yes    Partners: Male    Birth control/protection: Inserts    Comment: 1st intercourse- 21, partner-1  Other Topics Concern   Not on file  Social History Narrative   Not on file   Social Drivers of Health   Financial Resource Strain: Not on file  Food Insecurity: Low Risk  (05/19/2022)   Received from [facility]   Hunger Vital Sign    Within the past 12 months, you worried that your food would run out before you got money to buy more: Never true    Within the past 12 months, the food you bought just didn't last and  you didn't have money to get more. : Never true  Transportation Needs: Not on file (05/19/2022)  Physical Activity: Not on file  Stress: Not on file  Social Connections: Not on file  Intimate Partner Violence: Not on file   Family History  Problem Relation Age of Onset   Cancer Mother        pancreatic   Hypertension Father    Heart Problems Father    Diabetes Maternal Aunt    Diabetes Maternal Uncle    Diabetes Paternal Aunt    Diabetes Paternal Uncle    Glaucoma Maternal Grandmother    Colon cancer Paternal Grandmother    Heart attack Neg Hx    No Known Allergies   PE There were no vitals filed for this visit. There is no height or weight on file to calculate BMI.  Physical Exam    Assessment and Plan:        There are no diagnoses linked to this encounter. Clotilda FORBES Pa, CMA

## 2023-08-27 ENCOUNTER — Encounter: Payer: Self-pay | Admitting: Obstetrics and Gynecology

## 2023-08-27 ENCOUNTER — Ambulatory Visit: Admitting: Obstetrics and Gynecology

## 2023-08-27 VITALS — BP 118/72 | HR 63 | Temp 98.1°F | Ht 64.0 in | Wt 221.0 lb

## 2023-08-27 DIAGNOSIS — Z3044 Encounter for surveillance of vaginal ring hormonal contraceptive device: Secondary | ICD-10-CM

## 2023-08-27 DIAGNOSIS — Z01419 Encounter for gynecological examination (general) (routine) without abnormal findings: Secondary | ICD-10-CM | POA: Diagnosis not present

## 2023-08-27 DIAGNOSIS — N3941 Urge incontinence: Secondary | ICD-10-CM

## 2023-08-27 DIAGNOSIS — Z1331 Encounter for screening for depression: Secondary | ICD-10-CM | POA: Diagnosis not present

## 2023-08-27 MED ORDER — ETONOGESTREL-ETHINYL ESTRADIOL 0.12-0.015 MG/24HR VA RING
1.0000 | VAGINAL_RING | VAGINAL | 4 refills | Status: AC
Start: 2023-08-27 — End: ?

## 2023-08-27 NOTE — Assessment & Plan Note (Addendum)
 Patient with OAB symptoms. Education on cause provided. Discussed kegels and medical managements. AUGS pamphlets for bladder training and OAB provided. Consider meds of further evaluation if unimproved.

## 2023-08-27 NOTE — Patient Instructions (Signed)

## 2023-08-27 NOTE — Assessment & Plan Note (Signed)
 Cervical cancer screening performed according to ASCCP guidelines. Encouraged annual mammogram screening Colonoscopy UTD DXA N/A Labs and immunizations with her primary Encouraged safe sexual practices as indicated Encouraged healthy lifestyle practices with diet and exercise For patients under 46yo, I recommend 1000mg  calcium daily and 600IU of vitamin D daily.

## 2023-09-16 ENCOUNTER — Other Ambulatory Visit: Payer: Self-pay

## 2023-09-16 ENCOUNTER — Emergency Department (HOSPITAL_COMMUNITY): Admission: EM | Admit: 2023-09-16 | Discharge: 2023-09-16 | Disposition: A | Discharge status: Home / Self Care

## 2023-09-16 ENCOUNTER — Emergency Department (HOSPITAL_COMMUNITY)

## 2023-09-16 ENCOUNTER — Encounter (HOSPITAL_COMMUNITY): Payer: Self-pay

## 2023-09-16 DIAGNOSIS — S8262XA Displaced fracture of lateral malleolus of left fibula, initial encounter for closed fracture: Secondary | ICD-10-CM | POA: Insufficient documentation

## 2023-09-16 DIAGNOSIS — M25572 Pain in left ankle and joints of left foot: Secondary | ICD-10-CM | POA: Diagnosis present

## 2023-09-16 DIAGNOSIS — Y9301 Activity, walking, marching and hiking: Secondary | ICD-10-CM | POA: Insufficient documentation

## 2023-09-16 DIAGNOSIS — I10 Essential (primary) hypertension: Secondary | ICD-10-CM | POA: Insufficient documentation

## 2023-09-16 DIAGNOSIS — X501XXA Overexertion from prolonged static or awkward postures, initial encounter: Secondary | ICD-10-CM | POA: Insufficient documentation

## 2023-09-16 MED ORDER — NAPROXEN 250 MG PO TABS
500.0000 mg | ORAL_TABLET | Freq: Once | ORAL | Status: AC
Start: 2023-09-16 — End: 2023-09-16
  Administered 2023-09-16: 500 mg via ORAL
  Filled 2023-09-16: qty 2

## 2023-09-16 MED ORDER — OXYCODONE-ACETAMINOPHEN 5-325 MG PO TABS: 1.0000 | ORAL_TABLET | Freq: Once | ORAL | Status: DC

## 2023-09-16 MED ORDER — OXYCODONE-ACETAMINOPHEN 5-325 MG PO TABS: 1.0000 | ORAL_TABLET | Freq: Four times a day (QID) | ORAL | 0 refills | Status: AC | PRN

## 2023-09-16 NOTE — Discharge Instructions (Addendum)
 You were placed on a cam walker today, you may ambulate with this boot as tolerated.   You will need to follow up with Dr. Cristy, please call his office in order to schedule an appointment.   You were given a short prescription for Percocet, please take 1 tablet every 6 hours as needed for severe pain only.  Please be aware this medication contains Tylenol .

## 2023-09-16 NOTE — ED Notes (Signed)
 Awaiting patient from lobby.

## 2023-09-16 NOTE — Progress Notes (Signed)
 Orthopedic Tech Progress Note Patient Details:  Teresa Liu September 24, 1975 [id number]  Ortho Devices Type of Ortho Device: Crutches, CAM walker Ortho Device/Splint Location: LLE Ortho Device/Splint Interventions: Application   Post Interventions Patient Tolerated: Well  Massie BRAVO Briyanna Billingham 09/16/2023, 4:09 PM

## 2023-09-16 NOTE — ED Provider Triage Note (Signed)
 Emergency Medicine Provider Triage Evaluation Note  Teresa Liu , a 48 y.o. female  was evaluated in triage.  Pt complains of left ankle pain.  Patient states that she got a car subsequently rolled her left ankle.  Also complained about some slight right ankle pain as well.  Did not hit her head..  Review of Systems  Positive: Left/right ankle pain Negative: Headache/LOC/CP/SP  Physical Exam  BP (!) 154/75 (BP Location: Left Arm)   Pulse 77   Temp 99.1 F (37.3 C)   Resp 19   LMP 08/12/2023 (Approximate)   SpO2 100%  Gen:   Awake, no distress   Resp:  Normal effort  MSK:   Moves extremities without difficulty left ankle swelling  Other:    Medical Decision Making  Medically screening exam initiated at 11:23 AM.  Appropriate orders placed.  Teresa Liu was informed that the remainder of the evaluation will be completed by another provider, this initial triage assessment does not replace that evaluation, and the importance of remaining in the ED until their evaluation is complete.  No head strike.  No syncope.  Left ankle and right ankle pain.  Will obtain x-ray imaging.   Teresa Lavonia SAILOR, MD 09/16/23 1124

## 2023-09-16 NOTE — ED Provider Notes (Signed)
 [place] EMERGENCY DEPARTMENT AT [facility] Provider Note   CSN: [id number] Arrival date & time: 09/16/23  1059     Patient presents with: No chief complaint on file.   Teresa Liu is a 48 y.o. female.   48 y.o female with a PMH HTN, Migraines presents to the ED via EMS with a chief complaint of BL ankle pain s/p fall. Patient reports she was getting in the car at the dentist office when she walked back and stepped on a drain. Reports the drain was slanted and she twisted the left ankle losing her balance, she has not ambulated since the incident. She repots worsening pain to the left ankle on the lateral aspect. No MAT. No falls, no other complaints reported.   The history is provided by the patient.       Prior to Admission medications   Medication Sig Start Date End Date Taking? Authorizing Provider  oxyCODONE -acetaminophen  (PERCOCET/ROXICET) 5-325 MG tablet Take 1 tablet by mouth every 6 (six) hours as needed for up to 3 days for severe pain (pain score 7-10). 09/16/23 09/19/23 Yes Kaniesha Barile, PA-C  etonogestrel -ethinyl estradiol  (NUVARING ) 0.12-0.015 MG/24HR vaginal ring Place 1 each vaginally every 28 (twenty-eight) days. 08/27/23   Hines, Vera GAILS, MD  Fluocinolone Acetonide 0.01 % OIL Instill 4 drops nightly into ear(s) for 14 days. Then stop and use as needed for itching. 01/31/21   [provider]  folic acid (FOLVITE) 1 MG tablet TAKE 1 TABLET BY MOUTH EVERY DAY 10/21/18   [provider]  hydrocortisone 2.5 % ointment Apply topically 2 (two) times daily as needed. 04/27/23   [provider]  ibuprofen (ADVIL) 600 MG tablet Take 600 mg by mouth every 6 (six) hours as needed. 11/12/18   [provider]  methotrexate (RHEUMATREX) 2.5 MG tablet Take 2.5 mg by mouth once a week. Take 4 tablets by mouth weekly 11/18/18   [provider]  metoprolol  succinate (TOPROL -XL) 100 MG 24 hr tablet Take 1 tablet (100 mg total) by  mouth daily. 06/01/23   Nahser, Aleene PARAS, MD    Allergies: Patient has no known allergies.    Review of Systems  Constitutional:  Negative for chills and fever.  Respiratory:  Negative for shortness of breath.   Cardiovascular:  Negative for chest pain.  Musculoskeletal:  Positive for arthralgias.  All other systems reviewed and are negative.   Updated Vital Signs BP (!) 149/105 (BP Location: Left Arm)   Pulse 73   Temp 98 F (36.7 C) (Oral)   Resp 17   LMP 08/12/2023 (Approximate)   SpO2 100%   Physical Exam Vitals and nursing note reviewed.  Constitutional:      Appearance: Normal appearance.  HENT:     Head: Normocephalic and atraumatic.     Mouth/Throat:     Mouth: Mucous membranes are moist.  Cardiovascular:     Rate and Rhythm: Normal rate.     Pulses:          Dorsalis pedis pulses are 2+ on the right side and 2+ on the left side.       Posterior tibial pulses are 2+ on the right side.  Pulmonary:     Effort: Pulmonary effort is normal.  Abdominal:     General: Abdomen is flat.  Musculoskeletal:     Cervical back: Normal range of motion and neck supple.     Right ankle: No tenderness. No lateral malleolus tenderness.  Right Achilles Tendon: Normal.     Left ankle: Swelling present. Tenderness present over the lateral malleolus. Decreased range of motion.     Left Achilles Tendon: Normal.     Right foot: Normal range of motion.     Left foot: Normal range of motion.     Comments: LEFT: Decrease ROM due to pain, worsening pain along the lateral malleolus, 2+ DP pulse, PT harder to obtain due to swelling but present. Sensation is intact throughout.   Feet:     Right foot:     Skin integrity: Skin integrity normal. No warmth or callus.     Left foot:     Skin integrity: Skin integrity normal. No warmth or callus.  Skin:    General: Skin is warm and dry.  Neurological:     Mental Status: She is alert and oriented to person, place, and time.     (all labs  ordered are listed, but only abnormal results are displayed) Labs Reviewed - No data to display  EKG: None  Radiology: DG Ankle Complete Right Result Date: 09/16/2023 CLINICAL DATA:  Pain after fall. EXAM: RIGHT ANKLE - COMPLETE 3+ VIEW COMPARISON:  None Available. FINDINGS: No acute fracture or malalignment. Ankle mortise is congruent. Joint spaces are maintained. No significant focal soft tissue swelling. IMPRESSION: No acute osseous abnormality. Electronically Signed   By: Harrietta Sherry M.D.   On: 09/16/2023 12:13   DG Ankle Complete Left Result Date: 09/16/2023 CLINICAL DATA:  Pain after fall. EXAM: LEFT ANKLE COMPLETE - 3+ VIEW; LEFT TIBIA AND FIBULA - 2 VIEW COMPARISON:  None Available. FINDINGS: Minimally displaced fracture of the distal fibular metaphysis at the level of the base of the lateral malleolus. Ankle mortise appears congruent. No evidence of dislocation. There is an ankle joint effusion. Pronounced soft tissue swelling along the lateral ankle. IMPRESSION: 1. Minimally displaced fracture of the lateral malleolus. 2. Pronounced soft tissue swelling of the lateral ankle. 3. Ankle joint effusion. Electronically Signed   By: Harrietta Sherry M.D.   On: 09/16/2023 12:12   DG Tibia/Fibula Left Result Date: 09/16/2023 CLINICAL DATA:  Pain after fall. EXAM: LEFT ANKLE COMPLETE - 3+ VIEW; LEFT TIBIA AND FIBULA - 2 VIEW COMPARISON:  None Available. FINDINGS: Minimally displaced fracture of the distal fibular metaphysis at the level of the base of the lateral malleolus. Ankle mortise appears congruent. No evidence of dislocation. There is an ankle joint effusion. Pronounced soft tissue swelling along the lateral ankle. IMPRESSION: 1. Minimally displaced fracture of the lateral malleolus. 2. Pronounced soft tissue swelling of the lateral ankle. 3. Ankle joint effusion. Electronically Signed   By: Harrietta Sherry M.D.   On: 09/16/2023 12:12     Procedures   Medications Ordered in the ED   naproxen  (NAPROSYN ) tablet 500 mg (has no administration in time range)                                    Medical Decision Making Amount and/or Complexity of Data Reviewed Radiology: ordered.  Risk Prescription drug management.   Patient presented to the ED status post left ankle injury while leaving the dentist office.  Reports she twisted her left ankle.  She is having significant pain along the lateral malleolus.  Has not ambulated since the injury.  On exam she is neurovascularly intact, 2+ DP, PT pulses.  Sensation is intact throughout, good flexion and dorsiflexion  although limited somewhat due to pain.  Given naproxen  while in the ED as patient needs to go pick up her car from the dentist office.  We discussed the results of her images.  I personally consulted orthopedics Ozell Purchase, APP, who recommended cam walker, pain control along with follow-up with Dr. Cristy.  Patient is agreeable with plan and treatment at this time, given a copy of her x-ray for her records.  Return precautions discussed at length.  Patient hemodynamically stable for discharge.   Portions of this note were generated with Scientist, clinical (histocompatibility and immunogenetics). Dictation errors may occur despite best attempts at proofreading.   Final diagnoses:  Closed displaced fracture of lateral malleolus of left fibula, initial encounter    ED Discharge Orders          Ordered    oxyCODONE -acetaminophen  (PERCOCET/ROXICET) 5-325 MG tablet  Every 6 hours PRN        09/16/23 1548               Ersie Savino, PA-C 09/16/23 1601    Ula Prentice SAUNDERS, MD 09/16/23 2300

## 2023-09-16 NOTE — ED Triage Notes (Addendum)
 Pt bib ems from dentist office; pt states dropped phone in parking lot, went to pick up, fell in sewage drain; pan to L leg; no deformity, swelling noted to L ankle; did not hit head, no loc; bp 148 palp, P 72, 16 rr, 98% RA; pms intact; unable to bear weight on L foot

## 2023-09-16 NOTE — ED Notes (Addendum)
 Patient transported to X-ray

## 2023-11-04 ENCOUNTER — Other Ambulatory Visit: Payer: Self-pay | Admitting: Internal Medicine

## 2023-11-04 DIAGNOSIS — R921 Mammographic calcification found on diagnostic imaging of breast: Secondary | ICD-10-CM

## 2023-11-12 ENCOUNTER — Ambulatory Visit
Admission: RE | Admit: 2023-11-12 | Discharge: 2023-11-12 | Disposition: A | Discharge status: Home / Self Care | Source: Ambulatory Visit | Attending: Internal Medicine | Admitting: Internal Medicine

## 2023-11-12 DIAGNOSIS — R921 Mammographic calcification found on diagnostic imaging of breast: Secondary | ICD-10-CM

## 2024-08-29 ENCOUNTER — Ambulatory Visit: Admitting: Obstetrics and Gynecology
